# Patient Record
Sex: Male | Born: 1955
Health system: Southern US, Community
[De-identification: ages and names within clinical notes are randomized; demographics above are authoritative.]

## PROBLEM LIST (undated history)

## (undated) DIAGNOSIS — E785 Hyperlipidemia, unspecified: Secondary | ICD-10-CM

## (undated) DIAGNOSIS — I10 Essential (primary) hypertension: Secondary | ICD-10-CM

## (undated) DIAGNOSIS — E039 Hypothyroidism, unspecified: Secondary | ICD-10-CM

## (undated) DIAGNOSIS — Z9109 Other allergy status, other than to drugs and biological substances: Secondary | ICD-10-CM

## (undated) DIAGNOSIS — H269 Unspecified cataract: Secondary | ICD-10-CM

## (undated) HISTORY — DX: Hyperlipidemia, unspecified: E78.5

## (undated) HISTORY — DX: Hypothyroidism, unspecified: E03.9

## (undated) HISTORY — DX: Essential (primary) hypertension: I10

## (undated) HISTORY — PX: COLONOSCOPY: SHX174

## (undated) HISTORY — DX: Other allergy status, other than to drugs and biological substances: Z91.09

## (undated) HISTORY — DX: Unspecified cataract: H26.9

---
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
## 2005-09-06 LAB — HM COLONOSCOPY: HM Colonoscopy: NORMAL

## 2006-01-12 ENCOUNTER — Encounter: Payer: Self-pay | Admitting: Family Medicine

## 2006-12-04 ENCOUNTER — Encounter: Payer: Self-pay | Admitting: Family Medicine

## 2007-12-06 ENCOUNTER — Encounter: Payer: Self-pay | Admitting: Family Medicine

## 2008-10-06 ENCOUNTER — Encounter: Payer: Self-pay | Admitting: Family Medicine

## 2008-11-09 ENCOUNTER — Encounter: Payer: Self-pay | Admitting: Family Medicine

## 2009-06-22 ENCOUNTER — Ambulatory Visit: Payer: Self-pay | Admitting: Family Medicine

## 2009-06-22 DIAGNOSIS — E039 Hypothyroidism, unspecified: Secondary | ICD-10-CM

## 2009-06-22 DIAGNOSIS — I10 Essential (primary) hypertension: Secondary | ICD-10-CM | POA: Insufficient documentation

## 2009-08-16 ENCOUNTER — Ambulatory Visit: Payer: Self-pay | Admitting: Family Medicine

## 2009-08-18 ENCOUNTER — Ambulatory Visit: Payer: Self-pay | Admitting: Family Medicine

## 2009-11-24 ENCOUNTER — Ambulatory Visit: Payer: Self-pay | Admitting: Family Medicine

## 2009-11-26 LAB — CONVERTED CEMR LAB
BUN: 10 mg/dL (ref 6–23)
Basophils Relative: 0.2 % (ref 0.0–3.0)
Cholesterol: 197 mg/dL (ref 0–200)
Creatinine, Ser: 1 mg/dL (ref 0.4–1.5)
Eosinophils Relative: 1.4 % (ref 0.0–5.0)
GFR calc non Af Amer: 86.49 mL/min (ref >60)
HCT: 47.8 % (ref 39.0–52.0)
Hemoglobin: 16.6 g/dL (ref 13.0–17.0)
LDL Cholesterol: 134 mg/dL — ABNORMAL HIGH (ref 0–99)
Lymphs Abs: 1.4 10*3/uL (ref 0.7–4.0)
MCV: 94.6 fL (ref 78.0–100.0)
Monocytes Absolute: 0.4 10*3/uL (ref 0.1–1.0)
Potassium: 4.8 meq/L (ref 3.5–5.1)
RBC: 5.05 M/uL (ref 4.22–5.81)
Total Bilirubin: 1.2 mg/dL (ref 0.3–1.2)
Triglycerides: 161 mg/dL — ABNORMAL HIGH (ref 0.0–149.0)
VLDL: 32.2 mg/dL (ref 0.0–40.0)
WBC: 6.9 10*3/uL (ref 4.5–10.5)

## 2010-03-10 NOTE — Letter (Signed)
Summary: Out of Work  [facility] [employer] [facility] at [facility]  [address] [place], [place] [address]   Phone: [phone]  Fax: [phone]    August 16, 2009   Employee:  EZRAEL SAM    To Whom It May Concern:   For Medical reasons, please excuse the above named employee from work for the following dates:  Start:   08/16/2009  End:   he can return on 08/20/2009 if he feels better   If you need additional information, please feel free to contact our office.         Sincerely,    Judith Part MD

## 2010-03-10 NOTE — Letter (Signed)
Summary: Carlos Larson @ [facility] @ [employer]   Imported By: Lanelle Bal 07/12/2009 10:25:27  _____________________________________________________________________  External Attachment:    Type:   Image     Comment:   External Document

## 2010-03-10 NOTE — Letter (Signed)
Summary: Patient Questionnaire  Patient Questionnaire   Imported By: Beau Fanny 06/23/2009 10:09:21  _____________________________________________________________________  External Attachment:    Type:   Image     Comment:   External Document

## 2010-03-10 NOTE — Assessment & Plan Note (Signed)
Summary: CPX / LFW   Vital Signs:  Patient profile:   55 year old male Height:      67 inches Weight:      174.50 pounds BMI:     27.43 Temp:     98.3 degrees F oral Pulse rate:   80 / minute Pulse rhythm:   regular BP sitting:   126 / 84  (left arm) Cuff size:   regular  Vitals Entered By: Lewanda Rife LPN (November 24, 2009 9:35 AM) CC: CPX   History of Present Illness: here for health mt exam and to disc chronic med problems   feeling ok - nothing new   wt is down 2 lb-- is very active  getting ready to start hunting again    bp in good control 126/84   hypothyroid - does not feel different  father had colon ca last colonosc 07 nl - due in 2012  (was at [facility])    Td 7/11  is due for labs -- is fasting   psa- needs checked  prostate - usually gets up once at night no flow problems   flu shot - does not get them   no new moles or skin spots  sometimes wears a hat   Allergies (verified): No Known Drug Allergies  Past History:  Past Surgical History: Last updated: 06/22/2009 Tonsillectomy 1971  Family History: Last updated: 06/22/2009 Father: Living : colon cancer, high blood pressure Mother: Living : Heart disease/ valvular dz , hig blood pressure Maternal grandfather: Prostate cancer Paternal grandfather: High blood pressure MGM with CAD  no diabetes in family   Social History: Last updated: 06/22/2009 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 Eyes:  Denies blurring and eye irritation. CV:  Denies chest pain or discomfort and lightheadness. Resp:  Denies cough and wheezing. GI:  Denies abdominal pain, change in bowel habits, and  indigestion. GU:  Denies dysuria and urinary frequency. MS:  Denies muscle aches and cramps; some occ aches and pains . Derm:  Denies itching, lesion(s), poor wound healing, and rash. Neuro:  Denies headaches, numbness, and tingling. Psych:  Denies anxiety and depression. Endo:  Denies excessive thirst and excessive urination. Heme:  Denies abnormal bruising and bleeding.  Physical Exam  General:  Well-developed,well-nourished,in no acute distress; alert,appropriate and cooperative throughout examination Head:  normocephalic, atraumatic, and no abnormalities observed.   Eyes:  vision grossly intact, pupils equal, pupils round, and pupils reactive to light.  no conjunctival pallor, injection or icterus  Ears:  R ear normal and L ear normal.   Nose:  no nasal discharge.   Mouth:  pharynx pink and moist.   Neck:  supple with full rom and no masses or thyromegally, no JVD or carotid bruit  Chest Wall:  No deformities, masses, tenderness or gynecomastia noted. Lungs:  Normal respiratory effort, chest expands symmetrically. Lungs are clear to auscultation, no crackles or wheezes. Heart:  Normal rate and regular rhythm. S1 and S2 normal without gallop, murmur, click, rub or other extra sounds. Abdomen:  Bowel sounds positive,abdomen soft and non-tender without masses, organomegaly or hernias noted. no renal bruits  no renal bruits  Rectal:  No external abnormalities noted. Normal sphincter tone. No rectal  masses or tenderness. Prostate:  Prostate gland firm and smooth, no enlargement, nodularity, tenderness, mass, asymmetry or induration. Msk:  No deformity or scoliosis noted of thoracic or lumbar spine.  no acute joint changes  Pulses:  R and L carotid,radial,femoral,dorsalis pedis and posterior tibial pulses are full and equal bilaterally Extremities:  No clubbing, cyanosis, edema, or deformity noted with normal full range of motion of all joints.   Neurologic:  sensation intact to light touch,  gait normal, and DTRs symmetrical and normal.   Skin:  ruddy complexion  no rash  some lentigos  Cervical Nodes:  No lymphadenopathy noted Inguinal Nodes:  No significant adenopathy Psych:  quiet/ pleasant affect     Impression & Recommendations:  Problem # 1:  HEALTH MAINTENANCE EXAM (ICD-V70.0) Assessment Comment Only reviewed health habits including diet, exercise and skin cancer prevention reviewed health maintenance list and family history wellness lab today Orders: Venipuncture (54098) TLB-Lipid Panel (80061-LIPID) TLB-BMP (Basic Metabolic Panel-BMET) (80048-METABOL) TLB-Hepatic/Liver Function Pnl (80076-HEPATIC) TLB-CBC Platelet - w/Differential (85025-CBCD) TLB-TSH (Thyroid Stimulating Hormone) (84443-TSH) Prescription Created Electronically [id number])  Problem # 2:  SPECIAL SCREENING MALIGNANT NEOPLASM OF PROSTATE (ICD-V76.44) Assessment: Comment Only nl dre psa today Orders: Venipuncture (78295) TLB-Lipid Panel (80061-LIPID) TLB-BMP (Basic Metabolic Panel-BMET) (80048-METABOL) TLB-Hepatic/Liver Function Pnl (80076-HEPATIC) TLB-CBC Platelet - w/Differential (85025-CBCD) TLB-TSH (Thyroid Stimulating Hormone) (84443-TSH) TLB-PSA (Prostate Specific Antigen) (84153-PSA) Prescription Created Electronically [id number])  Problem # 3:  HYPOTHYROIDISM (ICD-244.9) Assessment: Unchanged no clinical changes  check lab today and update- tsh His updated medication list for this problem includes:    Levothyroxine Sodium 100 Mcg Tabs (Levothyroxine sodium) .Marland Kitchen... Take 1 tablet by mouth once a day  Orders: Venipuncture (86578) TLB-Lipid Panel (80061-LIPID) TLB-BMP (Basic Metabolic Panel-BMET) (80048-METABOL) TLB-Hepatic/Liver Function Pnl (80076-HEPATIC) TLB-CBC Platelet - w/Differential (85025-CBCD) TLB-TSH (Thyroid Stimulating Hormone) (46962-XBM) Prescription Created Electronically [phone])  Problem # 4:  HYPERTENSION, ESSENTIAL NOS (ICD-401.9) Assessment: Unchanged  bp  in good control disc healthy diet (low simple sugar/ choose complex carbs/ low sat fat) diet and exercise in detail  no change in med lab today His updated medication list for this problem includes:    Benazepril-hydrochlorothiazide 10-12.5 Mg Tabs (Benazepril-hydrochlorothiazide) .Marland Kitchen... Take 1 tablet by mouth once a day  Orders: Venipuncture (44010) TLB-Lipid Panel (80061-LIPID) TLB-BMP (Basic Metabolic Panel-BMET) (80048-METABOL) TLB-Hepatic/Liver Function Pnl (80076-HEPATIC) TLB-CBC Platelet - w/Differential (85025-CBCD) TLB-TSH (Thyroid Stimulating Hormone) (27253-GUY) Prescription Created Electronically [phone])  BP today: 126/84 Prior BP: 126/84 (08/18/2009)  Complete Medication List: 1)  Levothyroxine Sodium 100 Mcg Tabs (Levothyroxine sodium) .... Take 1 tablet by mouth once a day 2)  Benazepril-hydrochlorothiazide 10-12.5 Mg Tabs (Benazepril-hydrochlorothiazide) .... Take 1 tablet by mouth once a day 3)  Fish Oil 1200 Mg  .Marland KitchenMarland Kitchen. 1 by mouth once daily 4)  Vitamin D  .Marland Kitchen.. 1000 international units per day  Patient Instructions: 1)  you will be due for colonoscopy in 09/2010 -- call us june or july to schedule colonoscopy  2)  try to eat a healthy diet and stay active  3)  no change in medicine 4)  labs today  Prescriptions: BENAZEPRIL-HYDROCHLOROTHIAZIDE 10-12.5 MG TABS (BENAZEPRIL-HYDROCHLOROTHIAZIDE) Take 1 tablet by mouth once a day  #90 x 3   Entered and Authorized by:   Judith Part MD   Signed by:   Judith Part MD on 11/24/2009   Method used:   Electronically to        [facility] [phone]* (retail)       [phone]  [place], [place]  [address]       Ph: [phone] or [phone]       Fax: [phone]   RxID:   [id number] LEVOTHYROXINE SODIUM 100 MCG TABS (LEVOTHYROXINE SODIUM) Take 1 tablet by mouth once a day  #90 x 3   Entered and Authorized by:   Judith Part MD   Signed by:   Judith Part MD on  11/24/2009   Method used:   [facility] to        [facility] [phone]* (retail)       [address]       [place], [place]  [address]       Ph: [phone] or [phone]       Fax: [phone]   RxID:   [id number]    Orders Added: 1)  Venipuncture [93810] 2)  TLB-Lipid Panel [80061-LIPID] 3)  TLB-BMP (Basic Metabolic Panel-BMET) [80048-METABOL] 4)  TLB-Hepatic/Liver Function Pnl [80076-HEPATIC] 5)  TLB-CBC Platelet - w/Differential [85025-CBCD] 6)  TLB-TSH (Thyroid Stimulating Hormone) [84443-TSH] 7)  TLB-PSA (Prostate Specific Antigen) [17510-CHE] 8)  Prescription Created Electronically [G8553] 9)  Est. Patient 40-64 years [52778]    Current Allergies (reviewed today): No known allergies

## 2010-03-10 NOTE — Procedures (Signed)
Summary: Colonoscopy/[facility]  Colonoscopy/[facility]   Imported By: Lanelle Bal 07/12/2009 10:27:13  _____________________________________________________________________  External Attachment:    Type:   Image     Comment:   External Document

## 2010-03-10 NOTE — Assessment & Plan Note (Signed)
Summary: ROA FOR RECHECK OF HAND/JRR   Vital Signs:  Patient profile:   55 year old male Height:      67 inches Weight:      176.50 pounds BMI:     27.74 Temp:     97.5 degrees F oral Pulse rate:   72 / minute Pulse rhythm:   regular BP sitting:   126 / 84  (right arm) Cuff size:   regular  Vitals Entered By: Lewanda Rife LPN (August 18, 2009 4:01 PM) CC: recheck left hand   History of Present Illness: here for re check of dog bite to hand  on augmentin and had Td update at last visit   no fever  feels fine  has been resting his hand  sore to move - but does not throb or cause pain otherwise  still some swelling -- but looks better overall and not red      Allergies (verified): No Known Drug Allergies  Past History:  Past Medical History: Last updated: 06/22/2009 High blood pressure readings Thyroid problem mild pollen allergies   Past Surgical History: Last updated: 06/22/2009 Tonsillectomy 1971  Family History: Last updated: 06/22/2009 Father: Living : colon cancer, high blood pressure Mother: Living : Heart disease/ valvular dz , hig blood pressure Maternal grandfather: Prostate cancer Paternal grandfather: High blood pressure MGM with CAD  no diabetes in family   Social History: Last updated: 06/22/2009 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 CV:  Denies chest pain or discomfort. GI:  Denies abdominal pain, diarrhea, nausea, and vomiting. MS:  Complains of stiffness; denies muscle weakness. Derm:  Denies itching and rash. Neuro:  Denies numbness, tingling, and weakness.  Physical Exam  General:  Well-developed,well-nourished,in no acute distress; alert,appropriate and cooperative throughout examination Neck:  No  deformities, masses, or tenderness noted. Heart:  Normal rate and regular rhythm. S1 and S2 normal without gallop, murmur, click, rub or other extra sounds. Msk:  no acute joint changes  Skin:  L hand improvement in dog bite area  puncture wounds on dorsal hand and 3rd finger are healed without drainage slt erythema surrounding wounds swelling is improved  mild tenderness around periphery of dorsal hand wound is able to move all fingers with some discomfort  Cervical Nodes:  No lymphadenopathy noted Psych:  normal affect, talkative and pleasant    Impression & Recommendations:  Problem # 1:  DOG BITE (ICD-E906.0) improvement with augmentin and bacrtroban disc work schedule adv to finish abx and keep covered in public  update asap if inc swelling/redness/ pain or any fever  Complete Medication List: 1)  Levothyroxine Sodium 100 Mcg Tabs (Levothyroxine sodium) .... Take 1 tablet by mouth once a day 2)  Benazepril-hydrochlorothiazide 10-12.5 Mg Tabs (Benazepril-hydrochlorothiazide) .... Take 1 tablet by mouth once a day 3)  Fish Oil 1200 Mg  .Marland KitchenMarland Kitchen. 1 by mouth once daily 4)  Vitamin D  .Marland Kitchen.. 1000 international units per day 5)  Excedrin Extra Strength 250-250-65 Mg Tabs (Aspirin-acetaminophen-caffeine) .... Otc as directed. 6)  Bactroban Nasal 2 % Oint (Mupirocin calcium) .... Apply to affected area two times a day 7)  Augmentin 875-125 Mg Tabs (Amoxicillin-pot clavulanate) .Marland Kitchen.. 1 by mouth two times a day for 10 days  Patient Instructions: 1)  return to work as planned tomorrow  for light duty 2)  dress wound loosely when out in public  3)  continue current medicines  4)  update me if increase in swelling / pain/ redness or any fever - or if not improved next week   Current Allergies (reviewed today): No known allergies

## 2010-03-10 NOTE — Letter (Signed)
Summary: Out of Work  [employer] [facility] at [facility]  [address] [place], [place] [address]   Phone: [phone]  Fax: [phone]    August 18, 2009   Employee:  ROSHAD HACK    To Whom It May Concern:   For Medical reasons, please excuse the above named employee from work for the following dates:  Start:   08/18/2009  End:   can return for light duty 7/14 and 7/15 , and then full duty monday 08/23/2009 if he is feeling better   If you need additional information, please feel free to contact our office.         Sincerely,    Judith Part MD

## 2010-03-10 NOTE — Assessment & Plan Note (Signed)
Summary: NEW PT TO EST/CLE   Vital Signs:  Patient profile:   55 year old male Height:      67 inches Weight:      179 pounds BMI:     28.14 Temp:     98 degrees F oral Pulse rate:   68 / minute Pulse rhythm:   regular BP sitting:   134 / 90  (left arm) Cuff size:   regular  Vitals Entered By: Lewanda Rife LPN (Jun 22, 2009 11:18 AM)  Serial Vital Signs/Assessments:  Time      Position  BP       Pulse  Resp  Temp     By                     130/80                         Judith Part MD  CC: New pt to establish   History of Present Illness: here to est as new pt  used to see Dr at [facility]   HTN -- on benazepril- hct and stays in good control  usually when he checks at home -- usually upper 120s/ 70s-80 -- very good  no side eff from his medicine    last labs were in october -- with his last PE ? if did pSA at that time   has hypothyroid  has always been low no hx of problems - with goiter or overactive thyroid  labs were good in october   takes vit D 1000 international units per day  fish oil -- 1200 mg per day   eats a very healthy diet  works out with [employer] and hunts with his dogs   cholesterol has not been high  no hx of asthma some miild pollen allergies    Preventive Screening-Counseling & Management  Caffeine-Diet-Exercise     Does Patient Exercise: yes      Drug Use:  no.    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 Alcohol use-no Drug use-no Regular exercise-yes- lifts weights  Stopped smoking 1974 (smoked for 4 years)   Risk  Factors: Exercise: yes (06/22/2009)  Past Medical History: High blood pressure readings Thyroid problem mild pollen allergies   Past Surgical History: Tonsillectomy 1971  Family History: Father: Living : colon cancer, high blood pressure Mother: Living : Heart disease/ valvular dz , hig blood pressure Maternal grandfather: Prostate cancer Paternal grandfather: High blood pressure MGM with CAD  no diabetes in family   Social History: 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 Eyes:  Denies blurring and eye pain. CV:  Denies chest pain or discomfort and palpitations. Resp:  Denies cough, shortness of breath, sputum productive, and wheezing. GI:  Denies abdominal pain, bloody stools, change in bowel habits, indigestion, and nausea. GU:  Denies nocturia, urinary frequency, and urinary  hesitancy. MS:  Denies muscle aches and cramps. Derm:  Denies lesion(s), poor wound healing, and rash. Neuro:  Denies numbness and tingling. Psych:  Denies anxiety and depression. Endo:  Denies cold intolerance, excessive thirst, excessive urination, and heat intolerance. Heme:  Denies abnormal bruising and bleeding.  Physical Exam  General:  Well-developed,well-nourished,in no acute distress; alert,appropriate and cooperative throughout examination Head:  normocephalic, atraumatic, and no abnormalities observed.   Eyes:  vision grossly intact, pupils equal, pupils round, and pupils reactive to light.  no conjunctival pallor, injection or icterus  Mouth:  pharynx pink and moist.   Neck:  supple with full rom and no masses or thyromegally, no JVD or carotid bruit  Chest Wall:  No deformities, masses, tenderness or gynecomastia noted. Lungs:   Normal respiratory effort, chest expands symmetrically. Lungs are clear to auscultation, no crackles or wheezes. Heart:  Normal rate and regular rhythm. S1 and S2 normal without gallop, murmur, click, rub or other extra sounds. Abdomen:  Bowel sounds positive,abdomen soft and non-tender without masses, organomegaly or hernias noted. no renal bruits  Msk:  No deformity or scoliosis noted of thoracic or lumbar spine.  no acute joint changes  Extremities:  No clubbing, cyanosis, edema, or deformity noted with normal full range of motion of all joints.   Neurologic:  sensation intact to light touch, gait normal, and DTRs symmetrical and normal.  no tremor  Skin:  Intact without suspicious lesions or rashes Cervical Nodes:  No lymphadenopathy noted Inguinal Nodes:  No significant adenopathy Psych:  somewhat stoic affect    Impression & Recommendations:  Problem # 1:  HYPOTHYROIDISM (ICD-244.9) Assessment New no clinical changes - stable tsh per pt last oct  will send for last note and labs  f/u oct for PE with labs that day His updated medication list for this problem includes:    Levothyroxine Sodium 100 Mcg Tabs (Levothyroxine sodium) .Marland Kitchen... Take 1 tablet by mouth once a day  Problem # 2:  HYPERTENSION, ESSENTIAL NOS (ICD-401.9) Assessment: New this is in good control -- better reading with second check today disc low salt diet overall good exercise- commended no change in med sent for records f/u in oct for PE and labs that day refilled med today His updated medication list for this problem includes:    Benazepril-hydrochlorothiazide 10-12.5 Mg Tabs (Benazepril-hydrochlorothiazide) .Marland Kitchen... Take 1 tablet by mouth once a day  Complete Medication List: 1)  Levothyroxine Sodium 100 Mcg Tabs (Levothyroxine sodium) .... Take 1 tablet by mouth once a day 2)  Benazepril-hydrochlorothiazide 10-12.5 Mg Tabs (Benazepril-hydrochlorothiazide) .... Take 1 tablet by mouth once a day 3)  Fish Oil  1200 Mg  .Marland KitchenMarland Kitchen. 1 by mouth once daily 4)  Vitamin D  .Marland Kitchen.. 1000 international units per day  Patient Instructions: 1)  please send for last note and labs from [place] physicians (I think october for labs )  2)  no change in medicines 3)  keep up the good health habits  4)  schedule am physical in october (nothing to eat or drink after midnight except water)  Prescriptions: BENAZEPRIL-HYDROCHLOROTHIAZIDE 10-12.5 MG TABS (BENAZEPRIL-HYDROCHLOROTHIAZIDE) Take 1 tablet by mouth once a day  #90 x 3   Entered and Authorized by:   Judith Part MD   Signed by:   Judith Part MD on 06/22/2009   Method used:   [facility]        [facility] [phone]* (retail)       [address] [place]  [address]       Ph: [phone] or [phone]       Fax: [phone]   RxID:   [id number]   Current Allergies (reviewed today): No known allergies    Immunization History:  Tetanus/Td Immunization History:    Tetanus/Td:  historical (12/11/2001)    Preventive Care Screening  Colonoscopy:    Date:  09/06/2005    Next Due:  09/2010    Results:  normal   Last Tetanus Booster:    Date:  12/11/2001    Results:  Historical     Preventive Care Screening  Colonoscopy:    Date:  09/06/2005    Next Due:  09/2010    Results:  normal   Last Tetanus Booster:    Date:  12/11/2001    Results:  Historical

## 2010-03-10 NOTE — Letter (Signed)
Summary: Deboraha Sprang @ [facility] @ [employer]   Imported By: Lanelle Bal 07/12/2009 10:26:18  _____________________________________________________________________  External Attachment:    Type:   Image     Comment:   External Document

## 2010-03-10 NOTE — Assessment & Plan Note (Signed)
Summary: DOG BITE TO HAND   Vital Signs:  Patient profile:   55 year old male Height:      67 inches Weight:      178.25 pounds BMI:     28.02 Temp:     97.8 degrees F oral Pulse rate:   68 / minute Pulse rhythm:   regular BP sitting:   132 / 90  (right arm) Cuff size:   regular  Vitals Entered By: Lewanda Rife LPN (August 16, 2009 10:18 AM) CC: dog bit to hand on 08/15/09 at 6:45pm Last Td 12/11/2001   History of Present Illness: was bitten by a dog  yesterday-- 2 of his dogs started to fight (after breaking his cable)  he tried to break it up  bit him on the top of L hand -- after grabbing him by the collar   is still bleeding/ oozing a bit  is not hurting a lot -- just when he moves finger -- is stiff and swollen excedrin helped the pain   the boxer bit him  dog is up to date on shots    Td was in 03      Allergies (verified): No Known Drug Allergies  Past History:  Past Medical History: Last updated: 06/22/2009 High blood pressure readings Thyroid problem mild pollen allergies   Past Surgical History: Last updated: 06/22/2009 Tonsillectomy 1971  Family History: Last updated: 06/22/2009 Father: Living : colon cancer, high blood pressure Mother: Living : Heart disease/ valvular dz , hig blood pressure Maternal grandfather: Prostate cancer Paternal grandfather: High blood pressure MGM with CAD  no diabetes in family   Social History: Last updated: 06/22/2009 Occupation:Lineman for [employer]  Married Alcohol use-no Drug use-no Regular exercise-yes- lifts weights  Stopped smoking 1974 (smoked for 4 years)   Risk Factors: Exercise: yes (06/22/2009)  Review of Systems General:  Denies chills, fatigue, fever, and malaise. Eyes:  Denies eye irritation. CV:  Denies chest pain or discomfort and palpitations. Resp:  Denies shortness of breath and wheezing. GI:  Denies abdominal pain, change in bowel habits, and indigestion. MS:  Complains of  stiffness; denies cramps and muscle weakness. Derm:  Denies itching, lesion(s), poor wound healing, and rash. Neuro:  Denies numbness, tingling, and weakness. Endo:  Denies excessive thirst and excessive urination. Heme:  Denies abnormal bruising and bleeding.  Physical Exam  General:  Well-developed,well-nourished,in no acute distress; alert,appropriate and cooperative throughout examination Head:  normocephalic, atraumatic, and no abnormalities observed.   Eyes:  vision grossly intact, pupils equal, pupils round, and pupils reactive to light.   Neck:  supple with full rom and no masses or thyromegally, no JVD or carotid bruit  Lungs:  Normal respiratory effort, chest expands symmetrically. Lungs are clear to auscultation, no crackles or wheezes. Heart:  Normal rate and regular rhythm. S1 and S2 normal without gallop, murmur, click, rub or other extra sounds. Msk:  L hand with 1-2 cm laceration on dorsum of hand with swelling and also inner 3rd finger  mild ecchymosis no redness or warmth  Pulses:  nl pulses  Neurologic:  strength normal in all extremities, sensation intact to light touch, and DTRs symmetrical and normal.   Skin:  see MS exam for hand injury Cervical Nodes:  No lymphadenopathy noted Psych:  normal affect, talkative and pleasant    Impression & Recommendations:  Problem # 1:  DOG BITE (ICD-E906.0)  to L hand - with superficial puncture wound and full functionality  tx with augmentin  and bactroban - off work note f/u wed or thurs for re heck red flags to watch for disc as well as wound care  Td today  Orders: Prescription Created Electronically [phone])  Complete Medication List: 1)  Levothyroxine Sodium 100 Mcg Tabs (Levothyroxine sodium) .... Take 1 tablet by mouth once a day 2)  Benazepril-hydrochlorothiazide 10-12.5 Mg Tabs (Benazepril-hydrochlorothiazide) .... Take 1 tablet by mouth once a day 3)  Fish Oil 1200 Mg  .Marland KitchenMarland Kitchen. 1 by mouth once daily 4)  Vitamin D   .Marland Kitchen.. 1000 international units per day 5)  Excedrin Extra Strength 250-250-65 Mg Tabs (Aspirin-acetaminophen-caffeine) .... Otc as directed. 6)  Bactroban Nasal 2 % Oint (Mupirocin calcium) .... Apply to affected area two times a day 7)  Augmentin 875-125 Mg Tabs (Amoxicillin-pot clavulanate) .Marland Kitchen.. 1 by mouth two times a day for 10 days  Other Orders: TD Toxoids IM 7 YR + (47829) Admin 1st Vaccine (56213)  Patient Instructions: 1)  clean wound with antibacterial soap and water 2)  dress twice daily with clean gauze and bactroban ointment 3)  take augmentin as directed  4)  update me asap if pain / redness worsens or any fever  5)  follow up with me wed or thursday for re check  Prescriptions: AUGMENTIN 875-125 MG TABS (AMOXICILLIN-POT CLAVULANATE) 1 by mouth two times a day for 10 days  #20 x 0   Entered and Authorized by:   Judith Part MD   Signed by:   Judith Part MD on 08/16/2009   Method used:   [facility] to        [facility] (retail)       [address]       [place], [place]  [address]       Ph: [phone] or [phone]       Fax: [phone]   RxID:   [id number] BACTROBAN NASAL 2 % OINT (MUPIROCIN CALCIUM) apply to affected area two times a day  #1 small x 0   Entered and Authorized by:   Judith Part MD   Signed by:   Judith Part MD on 08/16/2009   Method used:   [facility]        [id number]       [address]       [place], [place]  [address]       Ph: [phone] or [phone]       Fax: [phone]   RxID:   [id number]    Immunizations Administered:  Tetanus Vaccine:    Vaccine Type: Td    Site: left deltoid    Mfr: Sanofi Pasteur    Dose: 0.5 ml    Route: IM    Given by: Lewanda Rife LPN    Exp. Date: 03/04/2011    Lot #: [id number]    VIS given: 12/25/06 version given August 16, 2009.   Current Allergies (reviewed today): No known allergies

## 2010-07-01 ENCOUNTER — Other Ambulatory Visit: Payer: Self-pay | Admitting: *Deleted

## 2010-07-01 MED ORDER — BENAZEPRIL-HYDROCHLOROTHIAZIDE 10-12.5 MG PO TABS: 1.0000 | ORAL_TABLET | Freq: Every day | ORAL | Status: DC

## 2010-09-27 ENCOUNTER — Telehealth: Payer: Self-pay | Admitting: *Deleted

## 2010-09-27 NOTE — Telephone Encounter (Signed)
That is a good idea- go ahead and schedule for the shot

## 2010-09-27 NOTE — Telephone Encounter (Signed)
Pt called because he was told to call in June or July to set up colonoscopy.  He he called to do that he was told that he isnt due for one until December, but he will be on vacation the entire month of December so will have it done in January.  [facility] will call him in November to schedule.  Also, pt is asking if he can get a Tdap, has a new baby in the family.

## 2010-09-27 NOTE — Telephone Encounter (Signed)
Will send to Jacki Cones to schedule.

## 2010-09-30 NOTE — Telephone Encounter (Signed)
Left message with pt's brother for him to call back.

## 2010-11-29 ENCOUNTER — Other Ambulatory Visit: Payer: Self-pay | Admitting: *Deleted

## 2010-11-29 MED ORDER — LEVOTHYROXINE SODIUM 100 MCG PO TABS: 100.0000 ug | ORAL_TABLET | Freq: Every day | ORAL | Status: DC

## 2010-12-22 ENCOUNTER — Other Ambulatory Visit: Payer: Self-pay | Admitting: *Deleted

## 2010-12-22 MED ORDER — BENAZEPRIL-HYDROCHLOROTHIAZIDE 10-12.5 MG PO TABS: 1.0000 | ORAL_TABLET | Freq: Every day | ORAL | Status: DC

## 2010-12-23 ENCOUNTER — Encounter: Payer: Self-pay | Admitting: Family Medicine

## 2010-12-26 ENCOUNTER — Encounter: Payer: Self-pay | Admitting: Family Medicine

## 2010-12-26 ENCOUNTER — Ambulatory Visit (INDEPENDENT_AMBULATORY_CARE_PROVIDER_SITE_OTHER): Payer: 59 | Admitting: Family Medicine

## 2010-12-26 ENCOUNTER — Encounter: Payer: Self-pay | Admitting: Internal Medicine

## 2010-12-26 VITALS — BP 118/78 | HR 68 | Temp 97.6°F | Ht 66.75 in | Wt 175.2 lb

## 2010-12-26 DIAGNOSIS — Z125 Encounter for screening for malignant neoplasm of prostate: Secondary | ICD-10-CM

## 2010-12-26 DIAGNOSIS — Z800 Family history of malignant neoplasm of digestive organs: Secondary | ICD-10-CM

## 2010-12-26 DIAGNOSIS — E039 Hypothyroidism, unspecified: Secondary | ICD-10-CM

## 2010-12-26 DIAGNOSIS — Z0000 Encounter for general adult medical examination without abnormal findings: Secondary | ICD-10-CM

## 2010-12-26 DIAGNOSIS — I10 Essential (primary) hypertension: Secondary | ICD-10-CM

## 2010-12-26 LAB — COMPREHENSIVE METABOLIC PANEL
ALT: 29 U/L (ref 0–53)
CO2: 31 mEq/L (ref 19–32)
Creatinine, Ser: 1 mg/dL (ref 0.4–1.5)
GFR: 87.2 mL/min (ref >60.00)
Glucose, Bld: 93 mg/dL (ref 70–99)
Total Bilirubin: 1.2 mg/dL (ref 0.3–1.2)

## 2010-12-26 LAB — LIPID PANEL
HDL: 35.9 mg/dL — ABNORMAL LOW (ref >39.00)
Triglycerides: 114 mg/dL (ref 0.0–149.0)

## 2010-12-26 LAB — CBC WITH DIFFERENTIAL/PLATELET
Basophils Absolute: 0 10*3/uL (ref 0.0–0.1)
HCT: 49.2 % (ref 39.0–52.0)
Hemoglobin: 16.6 g/dL (ref 13.0–17.0)
Lymphs Abs: 1.5 10*3/uL (ref 0.7–4.0)
MCHC: 33.8 g/dL (ref 30.0–36.0)
Monocytes Relative: 4 % (ref 3.0–12.0)
Neutro Abs: 4.8 10*3/uL (ref 1.4–7.7)
RDW: 13.1 % (ref 11.5–14.6)

## 2010-12-26 LAB — TSH: TSH: 0.84 u[IU]/mL (ref 0.35–5.50)

## 2010-12-26 MED ORDER — LEVOTHYROXINE SODIUM 100 MCG PO TABS: 100.0000 ug | ORAL_TABLET | Freq: Every day | ORAL | Status: DC

## 2010-12-26 MED ORDER — BENAZEPRIL-HYDROCHLOROTHIAZIDE 10-12.5 MG PO TABS: 1.0000 | ORAL_TABLET | Freq: Every day | ORAL | Status: DC

## 2010-12-26 NOTE — Assessment & Plan Note (Signed)
bp in fair control at this time  No changes needed  Disc lifstyle change with low sodium diet and exercise   meds refilled  

## 2010-12-26 NOTE — Progress Notes (Signed)
Subjective:    Patient ID: Carlos Larson, male    DOB: [birth year], 55 y.o.   MRN: [id number]  HPI Here for annual health mt exam and also to review chronic med problems  Is doing well - will be taking a whole month off in dec for vacation  Lots to do  Work is doing well too   HTN in good control 118/78 today On ace/ hct- no problems   Wt is stable with bmi of 27 Diet--eats healthy in general - notices more brisk bms esp with salads  Exercise- goes hunting and walks a lot with that  Otherwise just work   Due for labs today-- wants to do those now   Hx of hypothyroid Lab Results  Component Value Date   TSH 0.82 11/24/2009   No dose changes recently No clinical changes  No skin or hair changes   Flu shot- got at work mid [employer] nl 07 Father had colon cancer  Told by [facility] physicians -- needs to have done after December  Wants to change to [place]   Td 2011  Due for prostate screening  Does have to get up more at night - blames the diuretic  Stream is not quite as strong  GF and great uncle had prostate cancer   Patient Active Problem List  Diagnoses  . HYPOTHYROIDISM  . HYPERTENSION, ESSENTIAL NOS  . Routine general medical examination at a health care facility  . Prostate cancer screening  . Family history of colon cancer   Past Medical History  Diagnosis Date  . HTN (hypertension)   . Hypothyroid   . Pollen allergies     mild   Past Surgical History  Procedure Date  . Tonsillectomy 1971   History  Substance Use Topics  . Smoking status: Former Smoker    Quit date: 02/07/1972  . Smokeless tobacco: Not on file   Comment: Smoked for 4 years  . Alcohol Use: No   Family History  Problem Relation Age of Onset  . Colon cancer Father   . Hypertension Father   . Heart disease Mother     valvular disease  . Hypertension Mother   . Prostate cancer Maternal Grandfather   . Hypertension Paternal Grandfather   . Coronary artery disease  Maternal Grandmother   . Diabetes Neg Hx    Not on File Current Outpatient Prescriptions on File Prior to Visit  Medication Sig Dispense Refill  . cholecalciferol (VITAMIN D) 1000 UNITS tablet Take 1,000 Units by mouth daily.        . Omega-3 Fatty Acids (FISH OIL) 1200 MG CAPS Take 1 capsule by mouth daily.              Review of Systems Review of Systems  Constitutional: Negative for fever, appetite change, fatigue and unexpected weight change.  Eyes: Negative for pain and visual disturbance.  Respiratory: Negative for cough and shortness of breath.   Cardiovascular: Negative for cp or palpitations    Gastrointestinal: Negative for nausea, diarrhea and constipation.  Genitourinary: Negative for urgency and frequency.  Skin: Negative for pallor or rash   Neurological: Negative for weakness, light-headedness, numbness and headaches.  Hematological: Negative for adenopathy. Does not bruise/bleed easily.  Psychiatric/Behavioral: Negative for dysphoric mood. The patient is not nervous/anxious.          Objective:   Physical Exam  Constitutional: He appears well-developed and well-nourished. No distress.  HENT:  Head: Normocephalic and atraumatic.  Right Ear: External ear normal.  Left Ear: External ear normal.  Nose: Nose normal.  Mouth/Throat: Oropharynx is clear and moist.  Eyes: Conjunctivae and EOM are normal. Pupils are equal, round, and reactive to light. No scleral icterus.  Neck: Normal range of motion. Neck supple. No JVD present. Carotid bruit is not present. No thyromegaly present.  Cardiovascular: Normal rate, regular rhythm, normal heart sounds and intact distal pulses.  Exam reveals no gallop.   Pulmonary/Chest: Effort normal and breath sounds normal. No respiratory distress. He has no wheezes.  Abdominal: Soft. Bowel sounds are normal. He exhibits no distension, no abdominal bruit and no mass. There is no tenderness.  Genitourinary: Rectum normal and prostate  normal. Guaiac negative stool.  Musculoskeletal: Normal range of motion. He exhibits no edema and no tenderness.  Lymphadenopathy:    He has no cervical adenopathy.  Neurological: He is alert. He has normal reflexes. No cranial nerve deficit. He exhibits normal muscle tone. Coordination normal.  Skin: Skin is warm and dry. No rash noted. No erythema. No pallor.  Psychiatric: He has a normal mood and affect.          Assessment & Plan:

## 2010-12-26 NOTE — Patient Instructions (Signed)
We will refer you for colonoscopy at check out  Labs today  Keep up a healthy diet and exercise

## 2010-12-26 NOTE — Assessment & Plan Note (Signed)
Labs today for tsh No changes on 13 years  No clinical symptoms  Med refilled

## 2010-12-26 NOTE — Assessment & Plan Note (Signed)
Reviewed health habits including diet and exercise and skin cancer prevention Also reviewed health mt list, fam hx and immunizations  Wellness labs today 

## 2010-12-26 NOTE — Assessment & Plan Note (Signed)
Pt wants to change to [place] for this  Ref for screen colonosc due to fam hx colon cancer (5 years)- no stool changes

## 2010-12-26 NOTE — Assessment & Plan Note (Signed)
Nl prostate exam today Disc sympt of prostate enl with age- very mild  psa today and update

## 2011-01-10 ENCOUNTER — Telehealth: Payer: Self-pay | Admitting: *Deleted

## 2011-01-10 NOTE — Telephone Encounter (Signed)
Patient notified as instructed by telephone. Please see result note. Copy of lab mailed to pt.

## 2011-01-10 NOTE — Telephone Encounter (Signed)
Patient calling requesting his test results from before Thanksgiving and would like a copy mailed to him.

## 2011-01-23 ENCOUNTER — Ambulatory Visit (AMBULATORY_SURGERY_CENTER): Payer: 59 | Admitting: *Deleted

## 2011-01-23 ENCOUNTER — Telehealth: Payer: Self-pay | Admitting: *Deleted

## 2011-01-23 VITALS — Ht 67.0 in | Wt 177.8 lb

## 2011-01-23 DIAGNOSIS — Z800 Family history of malignant neoplasm of digestive organs: Secondary | ICD-10-CM

## 2011-01-23 DIAGNOSIS — Z1211 Encounter for screening for malignant neoplasm of colon: Secondary | ICD-10-CM

## 2011-01-23 MED ORDER — PEG-KCL-NACL-NASULF-NA ASC-C 100 G PO SOLR: ORAL | Status: DC

## 2011-01-23 NOTE — Telephone Encounter (Signed)
Release faxed to [facility] at [phone]

## 2011-01-23 NOTE — Progress Notes (Signed)
Patient states last colonoscopy was 5 years ago with [facility] on [address]. Patient states "normal" results. Patient does have family hx colon cancer and was told to have the exam every 5 years. Release of information form filled out and signed by patient and then given to [facility].

## 2011-01-23 NOTE — Telephone Encounter (Signed)
Patient for colonoscopy with Dr.Gessner on 02-10-11. He states his last colonoscopy was with [facility] 5 years ago with normal results per patient. Patient has family history of colon cancer(father) and was told to have a colonoscopy every 5 years. Release of information form filled out and signed by patient and given to [facility].

## 2011-01-24 ENCOUNTER — Encounter: Payer: Self-pay | Admitting: Internal Medicine

## 2011-01-25 NOTE — Telephone Encounter (Signed)
Records received

## 2011-02-10 ENCOUNTER — Encounter: Payer: Self-pay | Admitting: Internal Medicine

## 2011-02-10 ENCOUNTER — Ambulatory Visit (AMBULATORY_SURGERY_CENTER): Payer: 59 | Admitting: Internal Medicine

## 2011-02-10 VITALS — BP 144/86 | HR 77 | Temp 97.8°F | Resp 12 | Ht 67.0 in | Wt 177.0 lb

## 2011-02-10 DIAGNOSIS — Z1211 Encounter for screening for malignant neoplasm of colon: Secondary | ICD-10-CM

## 2011-02-10 DIAGNOSIS — Z800 Family history of malignant neoplasm of digestive organs: Secondary | ICD-10-CM

## 2011-02-10 MED ORDER — SODIUM CHLORIDE 0.9 % IV SOLN: 500.0000 mL | INTRAVENOUS | Status: DC

## 2011-02-10 NOTE — Patient Instructions (Addendum)
Mild diverticulosis (small pockets) were seen but no polyps or cancer were found. Next routine repeat colonoscopy in about 5 years. Iva Boop, MD, [facility]   Follow discharge instruction sheets (blue & green sheets)

## 2011-02-10 NOTE — Progress Notes (Signed)
Patient did not experience any of the following events: a burn prior to discharge; a fall within the facility; wrong site/side/patient/procedure/implant event; or a hospital transfer or hospital admission upon discharge from the facility. (G8907) Patient did not have preoperative order for IV antibiotic SSI prophylaxis. (G8918)  

## 2011-02-10 NOTE — Op Note (Signed)
[facility] [address] [place], [place]  [address]  COLONOSCOPY PROCEDURE REPORT  PATIENT:  Carlos Larson, Carlos Larson  MR#:  [id number] BIRTHDATE:  13-Jan-1956, 55 yrs. old  GENDER:  male ENDOSCOPIST:  Iva Boop, MD, [facility] REF. BY:  Marne A. Milinda Antis, M.D. PROCEDURE DATE:  02/10/2011 PROCEDURE:  Colonoscopy 19147 ASA CLASS:  Class II INDICATIONS:  Elevated Risk Screening, family history of colon cancer MEDICATIONS:   These medications were titrated to patient response per physician's verbal order, Fentanyl 75 mcg IV, Versed 6 mg IV  DESCRIPTION OF PROCEDURE:   After the risks benefits and alternatives of the procedure were thoroughly explained, informed consent was obtained.  Digital rectal exam was performed and revealed no rectal masses and an enlarged prostate.  Mildly enlarged, no nodules. The LB [id number] endoscope was introduced through the anus and advanced to the cecum, which was identified by both the appendix and ileocecal valve, without limitations.  The quality of the prep was excellent, using MoviPrep.  The instrument was then slowly withdrawn as the colon was fully examined. <<PROCEDUREIMAGES>>  FINDINGS:  Scattered diverticula were found in the sigmoid colon. This was otherwise a normal examination of the colon. Includes right colon retroflexion.   Retroflexed views in the rectum revealed no abnormalities.    The time to cecum = 2:18 minutes. The scope was then withdrawn in 8:57 minutes from the cecum and the procedure completed. COMPLICATIONS:  None ENDOSCOPIC IMPRESSION: 1) Diverticula, scattered in the sigmoid colon 2) Otherwise normal examination, excellent prep 3) Family history of colon cancer (father at 36)  REPEAT EXAM:  In 5 year(s) for routine screening colonoscopy.  Iva Boop, MD, [facility]  CC:  The Patient and Judy Pimple, MD  n. Rosalie Doctor:   Iva Boop at 02/10/2011 11:34 AM  Dagmar Hait, [phone]

## 2011-02-13 ENCOUNTER — Telehealth: Payer: Self-pay | Admitting: *Deleted

## 2011-02-13 NOTE — Telephone Encounter (Signed)
No answer on f/u phone call.  Left message at home # on Voicemail.

## 2011-08-15 ENCOUNTER — Telehealth: Payer: Self-pay | Admitting: Family Medicine

## 2011-08-15 NOTE — Telephone Encounter (Signed)
Looks like she scheduled it in oct - good

## 2011-08-15 NOTE — Telephone Encounter (Signed)
Go ahead and put any two 15 min slots together-thanks

## 2011-08-15 NOTE — Telephone Encounter (Signed)
Left message on patient's home answering machine to call office to make a physical appt in Oct or Nov per Dr. Milinda Antis.  Patient also previously said as long as the physical is in a calendar year the insurance company is ok with that.  His last physical was 12/26/10, but scheduling the next one in Oct of this year would be acceptable.

## 2011-08-15 NOTE — Telephone Encounter (Signed)
Patient needs a physical before 01/07/12 but there are no physical slots available until 01/10/12. Please advise as to your wishes.

## 2011-12-01 ENCOUNTER — Encounter: Payer: Self-pay | Admitting: Family Medicine

## 2011-12-01 ENCOUNTER — Ambulatory Visit (INDEPENDENT_AMBULATORY_CARE_PROVIDER_SITE_OTHER): Payer: 59 | Admitting: Family Medicine

## 2011-12-01 VITALS — BP 106/74 | HR 62 | Temp 98.4°F | Ht 67.0 in | Wt 178.8 lb

## 2011-12-01 DIAGNOSIS — Z125 Encounter for screening for malignant neoplasm of prostate: Secondary | ICD-10-CM

## 2011-12-01 DIAGNOSIS — E039 Hypothyroidism, unspecified: Secondary | ICD-10-CM

## 2011-12-01 DIAGNOSIS — I10 Essential (primary) hypertension: Secondary | ICD-10-CM

## 2011-12-01 DIAGNOSIS — Z0000 Encounter for general adult medical examination without abnormal findings: Secondary | ICD-10-CM

## 2011-12-01 LAB — CBC WITH DIFFERENTIAL/PLATELET
Basophils Relative: 0.4 % (ref 0.0–3.0)
Hemoglobin: 16.1 g/dL (ref 13.0–17.0)
Lymphocytes Relative: 27.8 % (ref 12.0–46.0)
Monocytes Relative: 5.4 % (ref 3.0–12.0)
Neutro Abs: 4.1 10*3/uL (ref 1.4–7.7)
RBC: 5.07 Mil/uL (ref 4.22–5.81)

## 2011-12-01 LAB — COMPREHENSIVE METABOLIC PANEL
ALT: 29 U/L (ref 0–53)
BUN: 13 mg/dL (ref 6–23)
CO2: 30 mEq/L (ref 19–32)
Calcium: 9.5 mg/dL (ref 8.4–10.5)
Chloride: 104 mEq/L (ref 96–112)
Creatinine, Ser: 0.9 mg/dL (ref 0.4–1.5)
GFR: 89.06 mL/min (ref >60.00)
Glucose, Bld: 91 mg/dL (ref 70–99)

## 2011-12-01 LAB — PSA: PSA: 2.19 ng/mL (ref 0.10–4.00)

## 2011-12-01 LAB — LIPID PANEL
Cholesterol: 207 mg/dL — ABNORMAL HIGH (ref 0–200)
Total CHOL/HDL Ratio: 7

## 2011-12-01 NOTE — Progress Notes (Signed)
Subjective:    Patient ID: Carlos Larson, male    DOB: [birth year], 56 y.o.   MRN: [id number]  HPI Here for health maintenance exam and to review chronic medical problems   Is doing well overall   No new medical issues   Wt is up 1 lb with bmi of 27.9  Flu vaccine- already had one , at work -- was in oct mid   Colonoscopy was 1/13 Has fam hx in father  Lab Results  Component Value Date   PSA 2.27 12/26/2010   PSA 1.67 11/24/2009  stream slows down a bit with age  occ nocturia     Hypothyroid- no changes he feels Energy level and skin/ hair are the same   Is getting exercise - hunting with his dogs/ very active   bp is stable today  No cp or palpitations or headaches or edema  No side effects to medicines  BP Readings from Last 3 Encounters:  12/01/11 106/74  02/10/11 144/86  12/26/10 118/78     Needs labs today   Patient Active Problem List  Diagnosis  . HYPOTHYROIDISM  . HYPERTENSION, ESSENTIAL NOS  . Routine general medical examination at a health care facility  . Prostate cancer screening  . Family history of colon cancer   Past Medical History  Diagnosis Date  . HTN (hypertension)   . Hypothyroid   . Pollen allergies     mild   Past Surgical History  Procedure Date  . Tonsillectomy 1971    and adnoids  . Colonoscopy 01/12/2006, 02/10/2011    2007 - Normal (Dr. Bosie Clos) 2013 - mild diverticulosis Leone Payor)   History  Substance Use Topics  . Smoking status: Former Smoker    Quit date: 02/07/1972  . Smokeless tobacco: Never Used   Comment: Smoked for 4 years  . Alcohol Use: No   Family History  Problem Relation Age of Onset  . Colon cancer Father 43  . Hypertension Father   . Heart disease Mother     valvular disease  . Hypertension Mother   . Prostate cancer Maternal Grandfather   . Hypertension Paternal Grandfather   . Coronary artery disease Maternal Grandmother   . Diabetes Neg Hx    No Known Allergies Current Outpatient  Prescriptions on File Prior to Visit  Medication Sig Dispense Refill  . benazepril-hydrochlorthiazide (LOTENSIN HCT) 10-12.5 MG per tablet Take 1 tablet by mouth daily.  90 tablet  3  . calcium carbonate (OS-CAL) 600 MG TABS Take 600 mg by mouth daily.      . cholecalciferol (VITAMIN D) 1000 UNITS tablet Take 1,000 Units by mouth daily.        Marland Kitchen levothyroxine (SYNTHROID, LEVOTHROID) 100 MCG tablet Take 1 tablet (100 mcg total) by mouth daily.  90 tablet  3  . Omega-3 Fatty Acids (FISH OIL) 1200 MG CAPS Take 1 capsule by mouth daily.           Review of Systems Review of Systems  Constitutional: Negative for fever, appetite change, fatigue and unexpected weight change.  Eyes: Negative for pain and visual disturbance.  ENT pos for some nasal congestion from ragweed  Respiratory: Negative for cough and shortness of breath.   Cardiovascular: Negative for cp or palpitations    Gastrointestinal: Negative for nausea, diarrhea and constipation.  Genitourinary: Negative for urgency and frequency.  Skin: Negative for pallor or rash   Neurological: Negative for weakness, light-headedness, numbness and headaches.  Hematological: Negative for adenopathy. Does  not bruise/bleed easily.  Psychiatric/Behavioral: Negative for dysphoric mood. The patient is not nervous/anxious.         Objective:   Physical Exam  Constitutional: He appears well-developed and well-nourished. No distress.  HENT:  Head: Normocephalic and atraumatic.  Right Ear: External ear normal.  Left Ear: External ear normal.  Nose: Nose normal.  Mouth/Throat: Oropharynx is clear and moist.       Nares are boggy  Eyes: Conjunctivae normal and EOM are normal. Pupils are equal, round, and reactive to light. No scleral icterus.  Neck: Normal range of motion. Neck supple. No JVD present. Carotid bruit is not present. No thyromegaly present.  Cardiovascular: Normal rate, regular rhythm, normal heart sounds and intact distal pulses.   Exam reveals no gallop.   Pulmonary/Chest: Effort normal and breath sounds normal. No respiratory distress. He has no wheezes. He exhibits no tenderness.  Abdominal: Soft. Bowel sounds are normal. He exhibits no distension, no abdominal bruit and no mass. There is no tenderness.  Genitourinary: Prostate is not enlarged and not tender.  Musculoskeletal: He exhibits no edema and no tenderness.  Lymphadenopathy:    He has no cervical adenopathy.  Neurological: He is alert. He has normal reflexes. No cranial nerve deficit. He exhibits normal muscle tone. Coordination normal.  Skin: Skin is warm and dry. No rash noted. No erythema. No pallor.  Psychiatric: He has a normal mood and affect.          Assessment & Plan:

## 2011-12-01 NOTE — Assessment & Plan Note (Signed)
Reviewed health habits including diet and exercise and skin cancer prevention Also reviewed health mt list, fam hx and immunizations  Wellness labs today Had his flu vaccine at work

## 2011-12-01 NOTE — Patient Instructions (Addendum)
Labs today  Blood pressure is good Try to stay healthy with good diet and exercise  I'm glad you had your flu vaccine

## 2011-12-01 NOTE — Assessment & Plan Note (Signed)
Hypothyroidism  Pt has no clinical changes No change in energy level/ hair or skin/ edema and no tremor Lab Results  Component Value Date   TSH 0.84 12/26/2010    Labs today for tsh

## 2011-12-01 NOTE — Assessment & Plan Note (Signed)
bp in fair control at this time  No changes needed  Disc lifstyle change with low sodium diet and exercise  Lab today 

## 2011-12-01 NOTE — Assessment & Plan Note (Signed)
DRE normal  psa today Remote family hx

## 2011-12-19 ENCOUNTER — Telehealth: Payer: Self-pay

## 2011-12-19 NOTE — Telephone Encounter (Signed)
Overall labs are stable  Chol is up a bit so watch diet - Avoid red meat/ fried foods/ egg yolks/ fatty breakfast meats/ butter, cheese and high fat dairy/ and shellfish   Also cr (kidney number) up slightly so try to drink more water  psa nl  Thyroid ok  No changes needed Will disc further at f/u

## 2011-12-19 NOTE — Telephone Encounter (Signed)
Pt does not have appt scheduled until next year and request call back with 11/2011 lab results.

## 2011-12-19 NOTE — Telephone Encounter (Signed)
Left voicemail requesting pt to call office, will try to call back later 

## 2011-12-19 NOTE — Telephone Encounter (Signed)
Pt notified and a copy of labs mailed to him per pt request

## 2011-12-27 ENCOUNTER — Other Ambulatory Visit: Payer: Self-pay | Admitting: Family Medicine

## 2012-04-22 ENCOUNTER — Telehealth: Payer: Self-pay | Admitting: Family Medicine

## 2012-04-22 NOTE — Telephone Encounter (Signed)
Patient Information:  Caller Name: Crystian  Phone: [phone]  Patient: Carlos Larson, Carlos Larson  Gender: Male  DOB: 07-Aug-1955  Age: 57 Years  PCP: Roxy Manns [facility])  Office Follow Up:  Does the office need to follow up with this patient?: No  Instructions For The Office: N/A   Symptoms  Reason For Call & Symptoms: 04/17/12 sx started that night, started with N&V and diarrhea,  04/18/12 nauseated all day and took Pepto Bismol.  The diarrhea has been intermittent.  He started taking immodium 04/20/12.  04/21/12 diarrhea continues and he was able to sleep.  Afebrile. Pt has voided x 2 this AM.  Reviewed Health History In EMR: Yes  Reviewed Medications In EMR: Yes  Reviewed Allergies In EMR: No  Reviewed Surgeries / Procedures: No  Date of Onset of Symptoms: 04/17/2012  Treatments Tried: Pepto Bismol and Immondium  Treatments Tried Worked: No  Guideline(s) Used:  Diarrhea  Disposition Per Guideline:   Home Care  Reason For Disposition Reached:   Mild diarrhea  Advice Given:  Reassurance:  In healthy adults, new-onset diarrhea is usually caused by a viral infection of the intestines, which you can treat at home. Diarrhea is the body's way of getting rid of the infection. Here are some tips on how to keep ahead of the fluid losses.  Fluids:  Drink more fluids, at least 8-10 glasses (8 oz or 240 ml) daily.  For example: sports drinks, diluted fruit juices, soft drinks.  Supplement this with saltine crackers or soups to make certain that you are getting sufficient fluid and salt to meet your body's needs.  Fluids:  Drink more fluids, at least 8-10 glasses (8 oz or 240 ml) daily.  For example: sports drinks, diluted fruit juices, soft drinks.  Supplement this with saltine crackers or soups to make certain that you are getting sufficient fluid and salt to meet your body's needs.  Avoid caffeinated beverages (Reason: caffeine is mildly dehydrating).  Nutrition:  Ideal initial  foods include boiled starches/cereals (e.g., potatoes, rice, noodles, wheat, oats) with a small amount of salt to taste.  Nutrition:  Ideal initial foods include boiled starches/cereals (e.g., potatoes, rice, noodles, wheat, oats) with a small amount of salt to taste.  Other acceptable foods include: bananas, yogurt, crackers, soup.   As your stools return to normal consistency, resume a normal diet.  Expected Course:  Viral diarrhea lasts 4-7 days. Always worse on days 1 and 2.  Patient Will Follow Care Advice:  YES

## 2012-10-23 ENCOUNTER — Telehealth: Payer: Self-pay

## 2012-10-23 NOTE — Telephone Encounter (Signed)
I will actually do the ua on the day of the physical so I can look at it at that time - tell pt to remind me when he comes

## 2012-10-23 NOTE — Telephone Encounter (Signed)
Pt left v/m requesting cb about his upcoming lab test. 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 Pt said he is having no problems at all; no abd or back pain, no fever, no burning or pain upon urination and no frequecy. Pt wanted to know if should have urinalysis when comes for CPX lab on 11/25/12; pt scheduled CPX 12/02/12. Please advise.

## 2012-10-24 NOTE — Telephone Encounter (Signed)
Left voicemail letting pt know we will do ua day of physical

## 2012-11-24 ENCOUNTER — Telehealth: Payer: Self-pay | Admitting: Family Medicine

## 2012-11-24 DIAGNOSIS — Z125 Encounter for screening for malignant neoplasm of prostate: Secondary | ICD-10-CM

## 2012-11-24 DIAGNOSIS — Z0000 Encounter for general adult medical examination without abnormal findings: Secondary | ICD-10-CM

## 2012-11-24 NOTE — Telephone Encounter (Signed)
Message copied by Judy Pimple on Sun Nov 24, 2012  1:16 PM ------      Message from: Alvina Chou      Created: Fri Nov 15, 2012  3:32 PM      Regarding: Lab orders for Monday, 10.20.14       Patient is scheduled for CPX labs, please order future labs, Thanks , Terri       ------

## 2012-11-25 ENCOUNTER — Other Ambulatory Visit (INDEPENDENT_AMBULATORY_CARE_PROVIDER_SITE_OTHER): Payer: 59

## 2012-11-25 DIAGNOSIS — I10 Essential (primary) hypertension: Secondary | ICD-10-CM

## 2012-11-25 DIAGNOSIS — Z0000 Encounter for general adult medical examination without abnormal findings: Secondary | ICD-10-CM

## 2012-11-25 DIAGNOSIS — E039 Hypothyroidism, unspecified: Secondary | ICD-10-CM

## 2012-11-25 DIAGNOSIS — Z125 Encounter for screening for malignant neoplasm of prostate: Secondary | ICD-10-CM

## 2012-11-25 LAB — COMPREHENSIVE METABOLIC PANEL
Albumin: 4.3 g/dL (ref 3.5–5.2)
CO2: 30 mEq/L (ref 19–32)
Calcium: 9.6 mg/dL (ref 8.4–10.5)
Chloride: 103 mEq/L (ref 96–112)
Creatinine, Ser: 1 mg/dL (ref 0.4–1.5)
GFR: 85.56 mL/min (ref >60.00)
Glucose, Bld: 109 mg/dL — ABNORMAL HIGH (ref 70–99)
Sodium: 139 mEq/L (ref 135–145)
Total Bilirubin: 1.3 mg/dL — ABNORMAL HIGH (ref 0.3–1.2)
Total Protein: 6.8 g/dL (ref 6.0–8.3)

## 2012-11-25 LAB — LIPID PANEL
Cholesterol: 158 mg/dL (ref 0–200)
VLDL: 40.8 mg/dL — ABNORMAL HIGH (ref 0.0–40.0)

## 2012-11-25 LAB — CBC WITH DIFFERENTIAL/PLATELET
Basophils Absolute: 0 10*3/uL (ref 0.0–0.1)
Eosinophils Relative: 1.8 % (ref 0.0–5.0)
HCT: 45.9 % (ref 39.0–52.0)
Hemoglobin: 16.1 g/dL (ref 13.0–17.0)
Lymphs Abs: 1.8 10*3/uL (ref 0.7–4.0)
MCHC: 35 g/dL (ref 30.0–36.0)
Monocytes Relative: 5.2 % (ref 3.0–12.0)
Neutro Abs: 5.2 10*3/uL (ref 1.4–7.7)
RDW: 13.1 % (ref 11.5–14.6)
WBC: 7.6 10*3/uL (ref 4.5–10.5)

## 2012-11-25 LAB — LDL CHOLESTEROL, DIRECT: Direct LDL: 80.4 mg/dL

## 2012-12-02 ENCOUNTER — Encounter: Payer: Self-pay | Admitting: Family Medicine

## 2012-12-02 ENCOUNTER — Ambulatory Visit (INDEPENDENT_AMBULATORY_CARE_PROVIDER_SITE_OTHER): Payer: 59 | Admitting: Family Medicine

## 2012-12-02 VITALS — BP 108/74 | HR 72 | Temp 98.2°F | Ht 67.0 in | Wt 179.8 lb

## 2012-12-02 DIAGNOSIS — M79609 Pain in unspecified limb: Secondary | ICD-10-CM

## 2012-12-02 DIAGNOSIS — Z125 Encounter for screening for malignant neoplasm of prostate: Secondary | ICD-10-CM

## 2012-12-02 DIAGNOSIS — I10 Essential (primary) hypertension: Secondary | ICD-10-CM

## 2012-12-02 DIAGNOSIS — Z0000 Encounter for general adult medical examination without abnormal findings: Secondary | ICD-10-CM

## 2012-12-02 DIAGNOSIS — E039 Hypothyroidism, unspecified: Secondary | ICD-10-CM

## 2012-12-02 DIAGNOSIS — M79644 Pain in right finger(s): Secondary | ICD-10-CM | POA: Insufficient documentation

## 2012-12-02 DIAGNOSIS — R319 Hematuria, unspecified: Secondary | ICD-10-CM

## 2012-12-02 DIAGNOSIS — Z800 Family history of malignant neoplasm of digestive organs: Secondary | ICD-10-CM

## 2012-12-02 LAB — POCT URINALYSIS DIPSTICK
Bilirubin, UA: NEGATIVE
Glucose, UA: NEGATIVE
Leukocytes, UA: NEGATIVE
Nitrite, UA: NEGATIVE
Urobilinogen, UA: 0.2

## 2012-12-02 MED ORDER — LEVOTHYROXINE SODIUM 100 MCG PO TABS: 100.0000 ug | ORAL_TABLET | Freq: Every day | ORAL | Status: DC

## 2012-12-02 MED ORDER — BENAZEPRIL-HYDROCHLOROTHIAZIDE 10-12.5 MG PO TABS: 1.0000 | ORAL_TABLET | Freq: Every day | ORAL | Status: DC

## 2012-12-02 NOTE — Assessment & Plan Note (Signed)
utd colonoscopy 2013

## 2012-12-02 NOTE — Patient Instructions (Signed)
If your thumb pain worsens make an appt here with Dr Patsy Lager in this office  We will refer you for urology visit at check out -for the blood in urine  Take care yourself  Avoid red meat/ fried foods/ egg yolks/ fatty breakfast meats/ butter, cheese and high fat dairy/ and shellfish   Also watch sugar in diet - try to drink more water

## 2012-12-02 NOTE — Assessment & Plan Note (Signed)
Stiff and sore -no deformity Will call for appt with Dr Patsy Lager for further eval

## 2012-12-02 NOTE — Assessment & Plan Note (Signed)
Hypothyroidism  Pt has no clinical changes No change in energy level/ hair or skin/ edema and no tremor Lab Results  Component Value Date   TSH 1.91 11/25/2012

## 2012-12-02 NOTE — Assessment & Plan Note (Signed)
Lab Results  Component Value Date   PSA 3.07 11/25/2012   PSA 2.19 12/01/2011   PSA 2.27 12/26/2010   Disc symptoms/ nocturia urol f/u for this and hematuria

## 2012-12-02 NOTE — Progress Notes (Signed)
Subjective:    Patient ID: Carlos Larson, male    DOB: 06-14-1955, 57 y.o.   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 If he could get shingles vaccine   colonosc 1/13 5 y recall due to fam hx   Td 7/11  Prostate screening  Lab Results  Component Value Date   PSA 3.07 11/25/2012   PSA 2.19 12/01/2011   PSA 2.27 12/26/2010   nocturia varies - between 0-3 times  No trouble getting stream going Most of the time- except occasionally  GF on mothers side had prostate cancer  Also a great uncle  No blood in urine visible    Hypothyroidism  Pt has no clinical changes No change in energy level/ hair or skin/ edema and no tremor Lab Results  Component Value Date   TSH 1.91 11/25/2012      bp is stable today  No cp or palpitations or headaches or edema  No side effects to medicines  BP Readings from Last 3 Encounters:  12/02/12 108/74  12/01/11 106/74  02/10/11 144/86      Hyperlipidemia Lab Results  Component Value Date   CHOL 158 11/25/2012   CHOL 207* 12/01/2011   CHOL 205* 12/26/2010   Lab Results  Component Value Date   HDL 28.30* 11/25/2012   HDL 30.40* 12/01/2011   HDL 35.90* 12/26/2010   Lab Results  Component Value Date   LDLCALC 134* 11/24/2009   Lab Results  Component Value Date   TRIG 204.0* 11/25/2012   TRIG 84.0 12/01/2011   TRIG 114.0 12/26/2010   Lab Results  Component Value Date   CHOLHDL 6 11/25/2012   CHOLHDL 7 12/01/2011   CHOLHDL 6 12/26/2010   Lab Results  Component Value Date   LDLDIRECT 80.4 11/25/2012   LDLDIRECT 156.2 12/01/2011   LDLDIRECT 146.3 12/26/2010    Stopped his fish oil habit - not as much exercise since is wife has  cancer - a lot of caregiver  Mood is prettty good -not depressed   Patient Active Problem List   Diagnosis Date Noted  . Routine general medical examination at a health care facility 12/26/2010  . Prostate cancer screening 12/26/2010  . Family history of colon cancer 12/26/2010  . HYPOTHYROIDISM 06/22/2009  . HYPERTENSION, ESSENTIAL NOS 06/22/2009   Past Medical History  Diagnosis Date  . HTN (hypertension)   . Hypothyroid   . Pollen allergies     mild   Past Surgical History  Procedure Laterality Date  . Tonsillectomy  1971    and adnoids  . Colonoscopy  01/12/2006, 02/10/2011    2007 - Normal (Dr. Bosie Clos) 2013 - mild diverticulosis Leone Payor)   History  Substance Use Topics  . Smoking status: Former Smoker    Quit date: 02/07/1972  . Smokeless tobacco: Never Used     Comment: Smoked for 4 years  . Alcohol Use: No   Family History  Problem Relation Age of Onset  . Colon cancer Father 30  . Hypertension Father   . Heart disease Mother     valvular disease  . Hypertension Mother   . Prostate  cancer Maternal Grandfather   . Hypertension Paternal Grandfather   . Coronary artery disease Maternal Grandmother   . Diabetes Neg Hx    No Known Allergies Current Outpatient Prescriptions on File Prior to Visit  Medication Sig Dispense Refill  . benazepril-hydrochlorthiazide (LOTENSIN HCT) 10-12.5 MG per tablet TAKE 1 TABLET BY MOUTH DAILY.  90 tablet  3  . cholecalciferol (VITAMIN D) 1000 UNITS tablet Take 1,000 Units by mouth daily.        Marland Kitchen levothyroxine (SYNTHROID, LEVOTHROID) 100 MCG tablet TAKE 1 TABLET BY MOUTH EVERY DAY  90 tablet  3   No current facility-administered medications on file prior to visit.     Review of Systems Review of Systems  Constitutional: Negative for fever, appetite change, fatigue and unexpected weight change.  Eyes: Negative for pain and visual disturbance.  Respiratory: Negative for cough and shortness of breath.   Cardiovascular:  Negative for cp or palpitations    Gastrointestinal: Negative for nausea, diarrhea and constipation.  Genitourinary: Negative for urgency and frequency.  Skin: Negative for pallor or rash  MSK pos for R thumb pain and loss of mobility  Neurological: Negative for weakness, light-headedness, numbness and headaches.  Hematological: Negative for adenopathy. Does not bruise/bleed easily.  Psychiatric/Behavioral: Negative for dysphoric mood. The patient is not nervous/anxious.          Objective:   Physical Exam  Constitutional: He appears well-developed and well-nourished. No distress.  HENT:  Head: Normocephalic and atraumatic.  Right Ear: External ear normal.  Left Ear: External ear normal.  Nose: Nose normal.  Mouth/Throat: Oropharynx is clear and moist.  Eyes: Conjunctivae and EOM are normal. Pupils are equal, round, and reactive to light. Right eye exhibits no discharge. Left eye exhibits no discharge. No scleral icterus.  Neck: Normal range of motion. Neck supple. No JVD present. Carotid bruit is not present. No thyromegaly present.  Cardiovascular: Normal rate, regular rhythm, normal heart sounds and intact distal pulses.  Exam reveals no gallop.   Pulmonary/Chest: Effort normal and breath sounds normal. No respiratory distress. He has no wheezes. He exhibits no tenderness.  Abdominal: Soft. Bowel sounds are normal. He exhibits no distension, no abdominal bruit and no mass. There is no tenderness.  No cva tenderness   Genitourinary: Rectum normal. Prostate is not tender.  Prostate smooth and nt and firm slt enl  Musculoskeletal: He exhibits tenderness. He exhibits no edema.  Limited rom of R thumb-distal PIP joint and tenderness at MCP joint without deformity  Lymphadenopathy:    He has no cervical adenopathy.  Neurological: He is alert. He has normal reflexes. No cranial nerve deficit. He exhibits normal muscle tone. Coordination normal.  Skin: Skin is warm and dry. No rash  noted. No erythema. No pallor.  Dry skin   Psychiatric: He has a normal mood and affect.  Somewhat of a blunted affect           Assessment & Plan:

## 2012-12-02 NOTE — Assessment & Plan Note (Signed)
Mod blood on dip times 2  fam hx of kidney stones No pain or other symptoms  Ref to [facility]

## 2012-12-02 NOTE — Assessment & Plan Note (Signed)
bp in fair control at this time  No changes needed  Disc lifstyle change with low sodium diet and exercise  Labs reviewed  

## 2012-12-02 NOTE — Assessment & Plan Note (Signed)
Reviewed health habits including diet and exercise and skin cancer prevention Also reviewed health mt list, fam hx and immunizations   Wellness labs reviewed  

## 2013-05-20 ENCOUNTER — Encounter: Payer: Self-pay | Admitting: Internal Medicine

## 2013-05-20 ENCOUNTER — Ambulatory Visit (INDEPENDENT_AMBULATORY_CARE_PROVIDER_SITE_OTHER): Payer: 59 | Admitting: Internal Medicine

## 2013-05-20 VITALS — BP 120/78 | HR 67 | Temp 97.9°F | Wt 180.0 lb

## 2013-05-20 DIAGNOSIS — R42 Dizziness and giddiness: Secondary | ICD-10-CM

## 2013-05-20 DIAGNOSIS — H9319 Tinnitus, unspecified ear: Secondary | ICD-10-CM

## 2013-05-20 MED ORDER — MECLIZINE HCL 50 MG PO TABS: 25.0000 mg | ORAL_TABLET | Freq: Three times a day (TID) | ORAL | Status: DC | PRN

## 2013-05-20 NOTE — Progress Notes (Signed)
Pre visit review using our clinic review tool, if applicable. No additional management support is needed unless otherwise documented below in the visit note. 

## 2013-05-20 NOTE — Patient Instructions (Addendum)

## 2013-05-21 ENCOUNTER — Encounter: Payer: Self-pay | Admitting: Internal Medicine

## 2013-05-21 NOTE — Progress Notes (Signed)
Subjective:    Patient ID: Carlos Larson, male    DOB: [birth year], 58 y.o.   MRN: [id number]  HPI  Pt presents to the clinic today with c/o a mild ringing in his ears and feeling off balance. He reports this started 1 week ago. He denies ear pain, but has had some headache and nausea, which now has resolved. He denies fever, chills, body aches or sinus symptoms. He did try flonase OTC. He denies chest pain, chest tightness or shortness of breath.  Review of Systems      Past Medical History  Diagnosis Date  . HTN (hypertension)   . Hypothyroid   . Pollen allergies     mild    Current Outpatient Prescriptions  Medication Sig Dispense Refill  . benazepril-hydrochlorthiazide (LOTENSIN HCT) 10-12.5 MG per tablet Take 1 tablet by mouth daily.  90 tablet  3  . cholecalciferol (VITAMIN D) 1000 UNITS tablet Take 1,000 Units by mouth daily.        Marland Kitchen levothyroxine (SYNTHROID, LEVOTHROID) 100 MCG tablet Take 1 tablet (100 mcg total) by mouth daily before breakfast.  90 tablet  3  . meclizine (ANTIVERT) 50 MG tablet Take 0.5 tablets (25 mg total) by mouth 3 (three) times daily as needed.  30 tablet  0   No current facility-administered medications for this visit.    No Known Allergies  Family History  Problem Relation Age of Onset  . Colon cancer Father 60  . Hypertension Father   . Heart disease Mother     valvular disease  . Hypertension Mother   . Prostate cancer Maternal Grandfather   . Hypertension Paternal Grandfather   . Coronary artery disease Maternal Grandmother   . Diabetes Neg Hx     History   Social History  . Marital Status: Single    Spouse Name: N/A    Number of Children: N/A  . Years of Education: N/A   Occupational History  . Lineman [employer]   Social History Main Topics  . Smoking status: Former Smoker    Quit date: 02/07/1972  . Smokeless tobacco: Never Used     Comment: Smoked for 4 years  . Alcohol Use: No  . Drug Use: No  . Sexual  Activity: Not on file   Other Topics Concern  . Not on file   Social History Narrative   Married      Regular exercise (lifts weights)      Lineman for [employer]     Constitutional: Denies fever, malaise, fatigue, headache or abrupt weight changes.  HEENT: Pt reports ringing in the ears. Denies eye pain, eye redness, ear pain, wax buildup, runny nose, nasal congestion, bloody nose, or sore throat. Respiratory: Denies difficulty breathing, shortness of breath, cough or sputum production.   Cardiovascular: Denies chest pain, chest tightness, palpitations or swelling in the hands or feet.  Neurological: Pt reports feeling off balance. Denies difficulty with memory, difficulty with speech or problems with coordination.   No other specific complaints in a complete review of systems (except as listed in HPI above).  Objective:   Physical Exam   BP 120/78  Pulse 67  Temp(Src) 97.9 F (36.6 C) (Oral)  Wt 180 lb (81.647 kg)  SpO2 98% Wt Readings from Last 3 Encounters:  05/20/13 180 lb (81.647 kg)  12/02/12 179 lb 12 oz (81.534 kg)  12/01/11 178 lb 12 oz (81.08 kg)    General: Appears his stated age, well developed, well  nourished in NAD. HEENT: Head: normal shape and size; Eyes: sclera white, no icterus, conjunctiva pink, PERRLA and EOMs intact; Ears: Tm's gray and intact, normal light reflex; Nose: mucosa pink and moist, septum midline; Throat/Mouth: Teeth present, mucosa pink and moist, no exudate, lesions or ulcerations noted.  Cardiovascular: Normal rate and rhythm. S1,S2 noted.  No murmur, rubs or gallops noted. No JVD or BLE edema. No carotid bruits noted. Pulmonary/Chest: Normal effort and positive vesicular breath sounds. No respiratory distress. No wheezes, rales or ronchi noted.  Neurological: Alert and oriented. Negative Rhomberg. Coordination normal. +DTRs bilaterally.   BMET    Component Value Date/Time   NA 139 11/25/2012 0746   K 3.7 11/25/2012 0746   CL 103  11/25/2012 0746   CO2 30 11/25/2012 0746   GLUCOSE 109* 11/25/2012 0746   BUN 10 11/25/2012 0746   CREATININE 1.0 11/25/2012 0746   CALCIUM 9.6 11/25/2012 0746   GFRNONAA 86.49 11/24/2009 1010    Lipid Panel     Component Value Date/Time   CHOL 158 11/25/2012 0746   TRIG 204.0* 11/25/2012 0746   HDL 28.30* 11/25/2012 0746   CHOLHDL 6 11/25/2012 0746   VLDL 40.8* 11/25/2012 0746   LDLCALC 134* 11/24/2009 1010    CBC    Component Value Date/Time   WBC 7.6 11/25/2012 0746   RBC 5.01 11/25/2012 0746   HGB 16.1 11/25/2012 0746   HCT 45.9 11/25/2012 0746   PLT 238.0 11/25/2012 0746   MCV 91.6 11/25/2012 0746   MCHC 35.0 11/25/2012 0746   RDW 13.1 11/25/2012 0746   LYMPHSABS 1.8 11/25/2012 0746   MONOABS 0.4 11/25/2012 0746   EOSABS 0.1 11/25/2012 0746   BASOSABS 0.0 11/25/2012 0746    Hgb A1C No results found for this basename: HGBA1C        Assessment & Plan:   Lightheadedness:  Negative orthostatics OK to continue flonase Rx for Meclizine 25 mg TID prn Drink plenty of fluids to avoid dehydration  RTC as needed or if symptoms persist or worsen

## 2013-12-10 ENCOUNTER — Other Ambulatory Visit: Payer: Self-pay | Admitting: Family Medicine

## 2013-12-11 ENCOUNTER — Other Ambulatory Visit: Payer: Self-pay | Admitting: *Deleted

## 2013-12-11 MED ORDER — BENAZEPRIL-HYDROCHLOROTHIAZIDE 10-12.5 MG PO TABS: 1.0000 | ORAL_TABLET | Freq: Every day | ORAL | Status: DC

## 2013-12-23 ENCOUNTER — Telehealth: Payer: Self-pay | Admitting: Family Medicine

## 2013-12-23 DIAGNOSIS — Z0000 Encounter for general adult medical examination without abnormal findings: Secondary | ICD-10-CM

## 2013-12-23 DIAGNOSIS — Z125 Encounter for screening for malignant neoplasm of prostate: Secondary | ICD-10-CM

## 2013-12-23 NOTE — Telephone Encounter (Signed)
-----   Message from Ellamae Sia sent at 12/18/2013  3:39 PM EST ----- Regarding: Lab orders for Wednesday, 11.18.15 Patient is scheduled for CPX labs, please order future labs, Thanks , Karna Christmas

## 2013-12-24 ENCOUNTER — Other Ambulatory Visit (INDEPENDENT_AMBULATORY_CARE_PROVIDER_SITE_OTHER): Payer: 59

## 2013-12-24 DIAGNOSIS — Z0000 Encounter for general adult medical examination without abnormal findings: Secondary | ICD-10-CM

## 2013-12-24 DIAGNOSIS — Z125 Encounter for screening for malignant neoplasm of prostate: Secondary | ICD-10-CM

## 2013-12-24 LAB — CBC WITH DIFFERENTIAL/PLATELET
BASOS PCT: 0.4 % (ref 0.0–3.0)
Basophils Absolute: 0 10*3/uL (ref 0.0–0.1)
EOS PCT: 2.5 % (ref 0.0–5.0)
Eosinophils Absolute: 0.2 10*3/uL (ref 0.0–0.7)
HEMATOCRIT: 49.1 % (ref 39.0–52.0)
Hemoglobin: 16.7 g/dL (ref 13.0–17.0)
LYMPHS ABS: 1.7 10*3/uL (ref 0.7–4.0)
Lymphocytes Relative: 25.3 % (ref 12.0–46.0)
MCHC: 34 g/dL (ref 30.0–36.0)
MCV: 93.7 fl (ref 78.0–100.0)
MONO ABS: 0.4 10*3/uL (ref 0.1–1.0)
Monocytes Relative: 6 % (ref 3.0–12.0)
Neutro Abs: 4.5 10*3/uL (ref 1.4–7.7)
Neutrophils Relative %: 65.8 % (ref 43.0–77.0)
Platelets: 206 10*3/uL (ref 150.0–400.0)
RBC: 5.24 Mil/uL (ref 4.22–5.81)
RDW: 13.4 % (ref 11.5–15.5)
WBC: 6.8 10*3/uL (ref 4.0–10.5)

## 2013-12-24 LAB — COMPREHENSIVE METABOLIC PANEL
ALBUMIN: 4.7 g/dL (ref 3.5–5.2)
ALK PHOS: 71 U/L (ref 39–117)
ALT: 26 U/L (ref 0–53)
AST: 25 U/L (ref 0–37)
BILIRUBIN TOTAL: 1.3 mg/dL — AB (ref 0.2–1.2)
BUN: 13 mg/dL (ref 6–23)
CO2: 29 mEq/L (ref 19–32)
Calcium: 10 mg/dL (ref 8.4–10.5)
Chloride: 103 mEq/L (ref 96–112)
Creatinine, Ser: 1 mg/dL (ref 0.4–1.5)
GFR: 84.23 mL/min (ref >60.00)
GLUCOSE: 104 mg/dL — AB (ref 70–99)
POTASSIUM: 4.6 meq/L (ref 3.5–5.1)
Sodium: 140 mEq/L (ref 135–145)
Total Protein: 7 g/dL (ref 6.0–8.3)

## 2013-12-24 LAB — LIPID PANEL
CHOLESTEROL: 226 mg/dL — AB (ref 0–200)
HDL: 36 mg/dL — ABNORMAL LOW (ref >39.00)
LDL CALC: 172 mg/dL — AB (ref 0–99)
NonHDL: 190
TRIGLYCERIDES: 90 mg/dL (ref 0.0–149.0)
Total CHOL/HDL Ratio: 6
VLDL: 18 mg/dL (ref 0.0–40.0)

## 2013-12-24 LAB — TSH: TSH: 0.39 u[IU]/mL (ref 0.35–4.50)

## 2013-12-24 LAB — PSA: PSA: 2.16 ng/mL (ref 0.10–4.00)

## 2013-12-31 ENCOUNTER — Encounter: Payer: 59 | Admitting: Family Medicine

## 2014-01-12 ENCOUNTER — Ambulatory Visit (INDEPENDENT_AMBULATORY_CARE_PROVIDER_SITE_OTHER): Payer: 59 | Admitting: Podiatry

## 2014-01-12 ENCOUNTER — Ambulatory Visit (INDEPENDENT_AMBULATORY_CARE_PROVIDER_SITE_OTHER): Payer: 59

## 2014-01-12 ENCOUNTER — Encounter: Payer: Self-pay | Admitting: Family Medicine

## 2014-01-12 ENCOUNTER — Encounter: Payer: Self-pay | Admitting: Podiatry

## 2014-01-12 ENCOUNTER — Ambulatory Visit (INDEPENDENT_AMBULATORY_CARE_PROVIDER_SITE_OTHER): Payer: 59 | Admitting: Family Medicine

## 2014-01-12 VITALS — BP 111/67 | HR 67 | Resp 16 | Ht 67.0 in | Wt 179.0 lb

## 2014-01-12 VITALS — BP 124/82 | HR 72 | Temp 98.3°F | Ht 67.75 in | Wt 179.0 lb

## 2014-01-12 DIAGNOSIS — Z125 Encounter for screening for malignant neoplasm of prostate: Secondary | ICD-10-CM

## 2014-01-12 DIAGNOSIS — M722 Plantar fascial fibromatosis: Secondary | ICD-10-CM

## 2014-01-12 DIAGNOSIS — E785 Hyperlipidemia, unspecified: Secondary | ICD-10-CM

## 2014-01-12 DIAGNOSIS — I10 Essential (primary) hypertension: Secondary | ICD-10-CM

## 2014-01-12 DIAGNOSIS — Z800 Family history of malignant neoplasm of digestive organs: Secondary | ICD-10-CM

## 2014-01-12 DIAGNOSIS — E039 Hypothyroidism, unspecified: Secondary | ICD-10-CM

## 2014-01-12 DIAGNOSIS — Z0000 Encounter for general adult medical examination without abnormal findings: Secondary | ICD-10-CM

## 2014-01-12 MED ORDER — MELOXICAM 15 MG PO TABS: 15.0000 mg | ORAL_TABLET | Freq: Every day | ORAL | Status: DC

## 2014-01-12 MED ORDER — LEVOTHYROXINE SODIUM 100 MCG PO TABS: ORAL_TABLET | ORAL | Status: DC

## 2014-01-12 MED ORDER — BENAZEPRIL-HYDROCHLOROTHIAZIDE 10-12.5 MG PO TABS: 1.0000 | ORAL_TABLET | Freq: Every day | ORAL | Status: DC

## 2014-01-12 MED ORDER — METHYLPREDNISOLONE (PAK) 4 MG PO TABS: ORAL_TABLET | ORAL | Status: DC

## 2014-01-12 NOTE — Assessment & Plan Note (Signed)
bp in fair control at this time  BP Readings from Last 1 Encounters:  01/12/14 124/82   No changes needed Disc lifstyle change with low sodium diet and exercise  Lab rev Med refilled

## 2014-01-12 NOTE — Progress Notes (Signed)
Pre visit review using our clinic review tool, if applicable. No additional management support is needed unless otherwise documented below in the visit note. 

## 2014-01-12 NOTE — Assessment & Plan Note (Signed)
Cholesterol is way up  LDL in 170s Pt thinks this is from eating fried foods Disc goals for lipids and reasons to control them Rev labs with pt Rev low sat fat diet in detail  Handout on diet given

## 2014-01-12 NOTE — Progress Notes (Addendum)
   Subjective:    Patient ID: Carlos Larson, male    DOB: 1955/11/15, 58 y.o.   MRN: [id number]  HPI Comments: Right plantar heel pain , that has been going on for about a year now. It hurts in the morning and eases off during day . After rest the first step down is the worse. The left heel started a little bit this weekend .  No treatment for either foot   Foot Pain      Review of Systems  All other systems reviewed and are negative.      Objective:   Physical Exam: I have reviewed his past medical history medications allergies surgery social history and review of systems. Pulses are strongly palpable bilateral. Neurologic sensorium is intact percent once the monofilament. Deep tendon reflexes are intact bilateral muscle strength is 5 over 5 dorsiflexion plantar flexors and inverters everters on physical musculatures intact. Orthopedic evaluation and x-rays pain on palpation medial calcaneal tubercle right greater than left. Radiographic evaluation doesn't stress soft tissue increase in density at the plantar fascial calcaneal insertion site of the bilateral heels. It appears that the right heel has developed a plantar fasciitis is in the left plantar fasciitis.        Assessment & Plan:  Assessment: Plantar fasciitis is right plantar fasciitis left.  Plan: Discussed the etiology pathology conservative versus surgical therapies. Discussed appropriate shoe gear stretching exercises ice therapy and shoe modifications. Injected the bilateral heel today. Plantar fascial strappings bilateral. Night splint right. Medrol Dosepak to be followed by meloxicam. We will follow-up with him in 1 month.

## 2014-01-12 NOTE — Assessment & Plan Note (Signed)
Hypothyroidism  Pt has no clinical changes No change in energy level/ hair or skin/ edema and no tremor Lab Results  Component Value Date   TSH 0.39 12/24/2013

## 2014-01-12 NOTE — Assessment & Plan Note (Signed)
Reviewed health habits including diet and exercise and skin cancer prevention Reviewed appropriate screening tests for age  Also reviewed health mt list, fam hx and immunization status , as well as social and family history   Labs reviewed  

## 2014-01-12 NOTE — Assessment & Plan Note (Signed)
Saw urol this year for DRE psa has leveled out  Lab Results  Component Value Date   PSA 2.16 12/24/2013   PSA 3.07 11/25/2012   PSA 2.19 12/01/2011    Will continue to follow His BPH symptoms continue to be very mild

## 2014-01-12 NOTE — Progress Notes (Signed)
Subjective:    Patient ID: Carlos Larson, male    DOB: 1955-05-20, 58 y.o.   MRN: [id number]  HPI Here for health maintenance exam and to review chronic medical problems    Has been feeling ok overall  Has appt with foot doctor today - ? Plantar fasciitis --- pain in bottom of foot when he gets up   Wt is down 1 lb with bmi of 27  Flu shot 10/15 Td 7/11  colonosc 1/13  Due 5 y  fam hx - father with colon cancer   bp is stable today  No cp or palpitations or headaches or edema  No side effects to medicines  BP Readings from Last 3 Encounters:  01/12/14 124/82  05/20/13 120/78  12/02/12 108/74      Hypothyroidism  Pt has no clinical changes No change in energy level/ hair or skin/ edema and no tremor Lab Results  Component Value Date   TSH 0.39 12/24/2013      Glucose 104- is watching sugar in diet - until thanksgiving    Lipids Lab Results  Component Value Date   CHOL 226* 12/24/2013   CHOL 158 11/25/2012   CHOL 207* 12/01/2011   Lab Results  Component Value Date   HDL 36.00* 12/24/2013   HDL 28.30* 11/25/2012   HDL 30.40* 12/01/2011   Lab Results  Component Value Date   LDLCALC 172* 12/24/2013   LDLCALC 134* 11/24/2009   Lab Results  Component Value Date   TRIG 90.0 12/24/2013   TRIG 204.0* 11/25/2012   TRIG 84.0 12/01/2011   Lab Results  Component Value Date   CHOLHDL 6 12/24/2013   CHOLHDL 6 11/25/2012   CHOLHDL 7 12/01/2011   Lab Results  Component Value Date   LDLDIRECT 80.4 11/25/2012   LDLDIRECT 156.2 12/01/2011   LDLDIRECT 146.3 12/26/2010   cholesterol is up quite a bit from last time  He was not on med  LDL up  HDL up a bit also    Prostate cancer screen Down a point from last year  He saw urology last year - due to inc in his year  (had exam) BPH - nocturia is once per night - and urinates a bit more often  Lab Results  Component Value Date   PSA 2.16 12/24/2013   PSA 3.07 11/25/2012   PSA 2.19 12/01/2011      Patient Active Problem List   Diagnosis Date Noted  . Hyperlipidemia 01/12/2014  . Hematuria 12/02/2012  . Pain of right thumb 12/02/2012  . Routine general medical examination at a health care facility 12/26/2010  . Prostate cancer screening 12/26/2010  . Family history of colon cancer 12/26/2010  . Hypothyroidism 06/22/2009  . Essential hypertension 06/22/2009   Past Medical History  Diagnosis Date  . HTN (hypertension)   . Hypothyroid   . Pollen allergies     mild   Past Surgical History  Procedure Laterality Date  . Tonsillectomy  1971    and adnoids  . Colonoscopy  01/12/2006, 02/10/2011    2007 - Normal (Dr. Michail Sermon) 2013 - mild diverticulosis Carlean Purl)   History  Substance Use Topics  . Smoking status: Former Smoker    Quit date: 02/07/1972  . Smokeless tobacco: Never Used     Comment: Smoked for 4 years  . Alcohol Use: No   Family History  Problem Relation Age of Onset  . Colon cancer Father 36  . Hypertension Father   . Heart disease  Mother     valvular disease  . Hypertension Mother   . Prostate cancer Maternal Grandfather   . Hypertension Paternal Grandfather   . Coronary artery disease Maternal Grandmother   . Diabetes Neg Hx    No Known Allergies Current Outpatient Prescriptions on File Prior to Visit  Medication Sig Dispense Refill  . cholecalciferol (VITAMIN D) 1000 UNITS tablet Take 1,000 Units by mouth daily.      . meloxicam (MOBIC) 15 MG tablet Take 1 tablet (15 mg total) by mouth daily. 30 tablet 3  . methylPREDNIsolone (MEDROL DOSPACK) 4 MG tablet 6 day tapering dose-follow package instructions 21 tablet 0   No current facility-administered medications on file prior to visit.    Review of Systems    Review of Systems  Constitutional: Negative for fever, appetite change, fatigue and unexpected weight change.  Eyes: Negative for pain and visual disturbance.  Respiratory: Negative for cough and shortness of breath.   Cardiovascular:  Negative for cp or palpitations    Gastrointestinal: Negative for nausea, diarrhea and constipation.  Genitourinary: Negative for urgency and frequency.  Skin: Negative for pallor or rash   MSK pos for foot pain  Neurological: Negative for weakness, light-headedness, numbness and headaches.  Hematological: Negative for adenopathy. Does not bruise/bleed easily.  Psychiatric/Behavioral: Negative for dysphoric mood. The patient is not nervous/anxious.      Objective:   Physical Exam  Constitutional: He appears well-developed and well-nourished. No distress.  HENT:  Head: Normocephalic and atraumatic.  Right Ear: External ear normal.  Left Ear: External ear normal.  Nose: Nose normal.  Mouth/Throat: Oropharynx is clear and moist.  Eyes: Conjunctivae and EOM are normal. Pupils are equal, round, and reactive to light. Right eye exhibits no discharge. Left eye exhibits no discharge. No scleral icterus.  Neck: Normal range of motion. Neck supple. No JVD present. Carotid bruit is not present. No thyromegaly present.  Cardiovascular: Normal rate, regular rhythm, normal heart sounds and intact distal pulses.  Exam reveals no gallop.   Pulmonary/Chest: Effort normal and breath sounds normal. No respiratory distress. He has no wheezes. He exhibits no tenderness.  Abdominal: Soft. Bowel sounds are normal. He exhibits no distension, no abdominal bruit and no mass. There is no tenderness.  Musculoskeletal: He exhibits no edema or tenderness.  No swelling or deformity of feet   Lymphadenopathy:    He has no cervical adenopathy.  Neurological: He is alert. He has normal reflexes. No cranial nerve deficit. He exhibits normal muscle tone. Coordination normal.  Skin: Skin is warm and dry. No rash noted. No erythema. No pallor.  Psychiatric: He has a normal mood and affect.          Assessment & Plan:   Problem List Items Addressed This Visit      Cardiovascular and Mediastinum   Essential  hypertension - Primary    bp in fair control at this time  BP Readings from Last 1 Encounters:  01/12/14 124/82   No changes needed Disc lifstyle change with low sodium diet and exercise  Lab rev Med refilled    Relevant Medications      benazepril-hydrochlorthiazide (LOTENSIN HCT) 10-12.5 MG per tablet     Endocrine   Hypothyroidism    Hypothyroidism  Pt has no clinical changes No change in energy level/ hair or skin/ edema and no tremor Lab Results  Component Value Date   TSH 0.39 12/24/2013        Relevant Medications  levothyroxine (SYNTHROID, LEVOTHROID) tablet     Other   Family history of colon cancer    utd colonosc  Due 2018    Hyperlipidemia    Cholesterol is way up  LDL in 170s Pt thinks this is from eating fried foods Disc goals for lipids and reasons to control them Rev labs with pt Rev low sat fat diet in detail  Handout on diet given     Relevant Medications      benazepril-hydrochlorthiazide (LOTENSIN HCT) 10-12.5 MG per tablet   Prostate cancer screening    Saw urol this year for DRE psa has leveled out  Lab Results  Component Value Date   PSA 2.16 12/24/2013   PSA 3.07 11/25/2012   PSA 2.19 12/01/2011    Will continue to follow His BPH symptoms continue to be very mild    Routine general medical examination at a health care facility    Reviewed health habits including diet and exercise and skin cancer prevention Reviewed appropriate screening tests for age  Also reviewed health mt list, fam hx and immunization status , as well as social and family history    Labs reviewed

## 2014-01-12 NOTE — Patient Instructions (Signed)

## 2014-01-12 NOTE — Assessment & Plan Note (Signed)
utd colonosc  Due 2018

## 2014-01-12 NOTE — Patient Instructions (Signed)
Take care of yourself  Your cholesterol is up quite a bit - please try to change your diet / fried foods may be the cause  Avoid red meat/ fried foods/ egg yolks/ fatty breakfast meats/ butter, cheese and high fat dairy/ and shellfish     Fat and Cholesterol Control Diet Fat and cholesterol levels in your blood and organs are influenced by your diet. High levels of fat and cholesterol may lead to diseases of the heart, small and large blood vessels, gallbladder, liver, and pancreas. CONTROLLING FAT AND CHOLESTEROL WITH DIET Although exercise and lifestyle factors are important, your diet is key. That is because certain foods are known to raise cholesterol and others to lower it. The goal is to balance foods for their effect on cholesterol and more importantly, to replace saturated and trans fat with other types of fat, such as monounsaturated fat, polyunsaturated fat, and omega-3 fatty acids. On average, a person should consume no more than 15 to 17 g of saturated fat daily. Saturated and trans fats are considered "bad" fats, and they will raise LDL cholesterol. Saturated fats are primarily found in animal products such as meats, butter, and cream. However, that does not mean you need to give up all your favorite foods. Today, there are good tasting, low-fat, low-cholesterol substitutes for most of the things you like to eat. Choose low-fat or nonfat alternatives. Choose round or loin cuts of red meat. These types of cuts are lowest in fat and cholesterol. Chicken (without the skin), fish, veal, and ground [place] breast are great choices. Eliminate fatty meats, such as hot dogs and salami. Even shellfish have little or no saturated fat. Have a 3 oz (85 g) portion when you eat lean meat, poultry, or fish. Trans fats are also called "partially hydrogenated oils." They are oils that have been scientifically manipulated so that they are solid at room temperature resulting in a longer shelf life and improved  taste and texture of foods in which they are added. Trans fats are found in stick margarine, some tub margarines, cookies, crackers, and baked goods.  When baking and cooking, oils are a great substitute for butter. The monounsaturated oils are especially beneficial since it is believed they lower LDL and raise HDL. The oils you should avoid entirely are saturated tropical oils, such as coconut and palm.  Remember to eat a lot from food groups that are naturally free of saturated and trans fat, including fish, fruit, vegetables, beans, grains (barley, rice, couscous, bulgur wheat), and pasta (without cream sauces).  IDENTIFYING FOODS THAT LOWER FAT AND CHOLESTEROL  Soluble fiber may lower your cholesterol. This type of fiber is found in fruits such as apples, vegetables such as broccoli, potatoes, and carrots, legumes such as beans, peas, and lentils, and grains such as barley. Foods fortified with plant sterols (phytosterol) may also lower cholesterol. You should eat at least 2 g per day of these foods for a cholesterol lowering effect.  Read package labels to identify low-saturated fats, trans fat free, and low-fat foods at the supermarket. Select cheeses that have only 2 to 3 g saturated fat per ounce. Use a heart-healthy tub margarine that is free of trans fats or partially hydrogenated oil. When buying baked goods (cookies, crackers), avoid partially hydrogenated oils. Breads and muffins should be made from whole grains (whole-wheat or whole oat flour, instead of "flour" or "enriched flour"). Buy non-creamy canned soups with reduced salt and no added fats.  FOOD PREPARATION TECHNIQUES  Never  deep-fry. If you must fry, either stir-fry, which uses very little fat, or use non-stick cooking sprays. When possible, broil, bake, or roast meats, and steam vegetables. Instead of putting butter or margarine on vegetables, use lemon and herbs, applesauce, and cinnamon (for squash and sweet potatoes). Use nonfat  yogurt, salsa, and low-fat dressings for salads.  LOW-SATURATED FAT / LOW-FAT FOOD SUBSTITUTES Meats / Saturated Fat (g)  Avoid: Steak, marbled (3 oz/85 g) / 11 g  Choose: Steak, lean (3 oz/85 g) / 4 g  Avoid: Hamburger (3 oz/85 g) / 7 g  Choose: Hamburger, lean (3 oz/85 g) / 5 g  Avoid: Ham (3 oz/85 g) / 6 g  Choose: Ham, lean cut (3 oz/85 g) / 2.4 g  Avoid: Chicken, with skin, dark meat (3 oz/85 g) / 4 g  Choose: Chicken, skin removed, dark meat (3 oz/85 g) / 2 g  Avoid: Chicken, with skin, light meat (3 oz/85 g) / 2.5 g  Choose: Chicken, skin removed, light meat (3 oz/85 g) / 1 g Dairy / Saturated Fat (g)  Avoid: Whole milk (1 cup) / 5 g  Choose: Low-fat milk, 2% (1 cup) / 3 g  Choose: Low-fat milk, 1% (1 cup) / 1.5 g  Choose: Skim milk (1 cup) / 0.3 g  Avoid: Hard cheese (1 oz/28 g) / 6 g  Choose: Skim milk cheese (1 oz/28 g) / 2 to 3 g  Avoid: Cottage cheese, 4% fat (1 cup) / 6.5 g  Choose: Low-fat cottage cheese, 1% fat (1 cup) / 1.5 g  Avoid: Ice cream (1 cup) / 9 g  Choose: Sherbet (1 cup) / 2.5 g  Choose: Nonfat frozen yogurt (1 cup) / 0.3 g  Choose: Frozen fruit bar / trace  Avoid: Whipped cream (1 tbs) / 3.5 g  Choose: Nondairy whipped topping (1 tbs) / 1 g Condiments / Saturated Fat (g)  Avoid: Mayonnaise (1 tbs) / 2 g  Choose: Low-fat mayonnaise (1 tbs) / 1 g  Avoid: Butter (1 tbs) / 7 g  Choose: Extra light margarine (1 tbs) / 1 g  Avoid: Coconut oil (1 tbs) / 11.8 g  Choose: Olive oil (1 tbs) / 1.8 g  Choose: Corn oil (1 tbs) / 1.7 g  Choose: Safflower oil (1 tbs) / 1.2 g  Choose: Sunflower oil (1 tbs) / 1.4 g  Choose: Soybean oil (1 tbs) / 2.4 g  Choose: Canola oil (1 tbs) / 1 g Document Released: 01/23/2005 Document Revised: 05/20/2012 Document Reviewed: 04/23/2013 Exit[employer] Patient Information 2015 [place], [place]. This information is not intended to replace advice given to you by your health care provider. Make sure you  discuss any questions you have with your health care provider.

## 2014-01-13 ENCOUNTER — Telehealth: Payer: Self-pay | Admitting: Family Medicine

## 2014-01-13 NOTE — Telephone Encounter (Signed)
emmi mailed  °

## 2014-02-09 ENCOUNTER — Ambulatory Visit: Payer: 59 | Admitting: Podiatry

## 2014-02-23 ENCOUNTER — Ambulatory Visit (INDEPENDENT_AMBULATORY_CARE_PROVIDER_SITE_OTHER): Payer: Commercial Managed Care - PPO | Admitting: Podiatry

## 2014-02-23 ENCOUNTER — Encounter: Payer: Self-pay | Admitting: Podiatry

## 2014-02-23 DIAGNOSIS — M722 Plantar fascial fibromatosis: Secondary | ICD-10-CM

## 2014-02-23 NOTE — Progress Notes (Signed)
He presents today for follow-up of plantar fasciitis bilateral. He states that he is approximately 80% better but his left heel hurts more than the right.  Objective: Vital signs are stable he is alert and oriented 3 pulses are palpable bilateral. He has pain on palpation medial calcaneal tubercle of the left heel none on the right.  Assessment: Plantar fasciitis left greater than right right is resolving.  Plan: Injected the left heel today with Kenalog and local anesthetic and continue all conservative therapies until he is 100% improved +1 month.

## 2014-03-23 ENCOUNTER — Ambulatory Visit: Payer: Self-pay | Admitting: Podiatry

## 2014-05-12 ENCOUNTER — Encounter: Payer: Self-pay | Admitting: Primary Care

## 2014-05-12 ENCOUNTER — Ambulatory Visit (INDEPENDENT_AMBULATORY_CARE_PROVIDER_SITE_OTHER): Payer: 59 | Admitting: Primary Care

## 2014-05-12 ENCOUNTER — Ambulatory Visit (INDEPENDENT_AMBULATORY_CARE_PROVIDER_SITE_OTHER)
Admission: RE | Admit: 2014-05-12 | Discharge: 2014-05-12 | Disposition: A | Discharge status: Home / Self Care | Payer: 59 | Source: Ambulatory Visit | Attending: Primary Care | Admitting: Primary Care

## 2014-05-12 VITALS — BP 134/84 | HR 71 | Temp 98.1°F | Ht 67.0 in | Wt 180.8 lb

## 2014-05-12 DIAGNOSIS — R059 Cough, unspecified: Secondary | ICD-10-CM

## 2014-05-12 DIAGNOSIS — R05 Cough: Secondary | ICD-10-CM

## 2014-05-12 MED ORDER — DOXYCYCLINE HYCLATE 100 MG PO TABS: 100.0000 mg | ORAL_TABLET | Freq: Two times a day (BID) | ORAL | Status: DC

## 2014-05-12 NOTE — Progress Notes (Signed)
Subjective:    Patient ID: Carlos Larson, male    DOB: November 01, 1955, 60 y.o.   MRN: [id number]  HPI  Mr. Mccollam is a 59 year old male who presents today with a chief complaint of productive cough (greenish sputum) with chest congestion and shortness of breath that has been present for 2 weeks. His symptoms began with sinus congestion with improvement after taking Flonase, and then noticed the cough with chest congestion. He's been taking Mucinex, a cough suppressant, and severe cold and flu OTC. The cough suppressant has worked to relief cough and he's been taking benadryl at night for sleep. He denies nausea, but reports he may have had a fever last week. He's a non-smoker and he has had no improvement in his cough symptoms in the past 2 weeks.    Review of Systems  Constitutional: Positive for chills. Negative for fever.  HENT: Negative for ear pain, rhinorrhea, sinus pressure and sore throat.   Respiratory: Positive for shortness of breath. Negative for wheezing.   Cardiovascular: Negative for chest pain.  Gastrointestinal: Negative for nausea and vomiting.  Musculoskeletal: Negative for myalgias.  Neurological: Negative for dizziness and headaches.  Hematological: Negative for adenopathy.       Past Medical History  Diagnosis Date  . HTN (hypertension)   . Hypothyroid   . Pollen allergies     mild    History   Social History  . Marital Status: Single    Spouse Name: N/A  . Number of Children: N/A  . Years of Education: N/A   Occupational History  . Lineman [employer]   Social History Main Topics  . Smoking status: Former Smoker    Quit date: 02/07/1972  . Smokeless tobacco: Never Used     Comment: Smoked for 4 years  . Alcohol Use: No  . Drug Use: No  . Sexual Activity: Not on file   Other Topics Concern  . Not on file   Social History Narrative   Married      Regular exercise (lifts weights)      Lineman for [employer]    Past Surgical History    Procedure Laterality Date  . Tonsillectomy  1971    and adnoids  . Colonoscopy  01/12/2006, 02/10/2011    2007 - Normal (Dr. Michail Sermon) 2013 - mild diverticulosis Carlean Purl)    Family History  Problem Relation Age of Onset  . Colon cancer Father 72  . Hypertension Father   . Heart disease Mother     valvular disease  . Hypertension Mother   . Prostate cancer Maternal Grandfather   . Hypertension Paternal Grandfather   . Coronary artery disease Maternal Grandmother   . Diabetes Neg Hx     No Known Allergies  Current Outpatient Prescriptions on File Prior to Visit  Medication Sig Dispense Refill  . benazepril-hydrochlorthiazide (LOTENSIN HCT) 10-12.5 MG per tablet Take 1 tablet by mouth daily. 90 tablet 3  . cholecalciferol (VITAMIN D) 1000 UNITS tablet Take 1,000 Units by mouth daily.      Marland Kitchen levothyroxine (SYNTHROID, LEVOTHROID) 100 MCG tablet TAKE 1 TABLET BY MOUTH DAILY BEFORE BREAKFAST. 90 tablet 3   No current facility-administered medications on file prior to visit.    BP 134/84 mmHg  Pulse 71  Temp(Src) 98.1 F (36.7 C) (Oral)  Ht 5\' 7"  (1.702 m)  Wt 180 lb 12.8 oz (82.01 kg)  BMI 28.31 kg/m2  SpO2 97%    Objective:   Physical Exam  Constitutional: He is oriented to person, place, and time.  Appears sickly  HENT:  Right Ear: External ear normal.  Left Ear: External ear normal.  Nose: Nose normal.  Mouth/Throat: Oropharynx is clear and moist. No oropharyngeal exudate.  Eyes: Conjunctivae are normal. Pupils are equal, round, and reactive to light.  Neck: Neck supple.  Cardiovascular: Normal rate and regular rhythm.   Pulmonary/Chest: Effort normal.  Crackles to RLL  Lymphadenopathy:    He has cervical adenopathy.  Neurological: He is alert and oriented to person, place, and time.  Skin: Skin is warm and dry.  Psychiatric: He has a normal mood and affect.          Assessment & Plan:  Cough: Suspect this may be community acquired pneumonia. Will  obtain xray to rule out and will also treat empirically with Doxycycline given duration of symptoms and exam. Continue cough suppressant, drink plenty of fluids. Mask provided to patient to use around his family members. Patient to follow up in 3-4 days if no improvement.

## 2014-05-12 NOTE — Patient Instructions (Signed)
Obtain xray prior to leaving. I will notify you of your results. Start Doxycycline tablets for infection. One tablet by mouth twice daily. Continue your cough suppressants as discussed. Call me if no improvement in the next 3-4 days. Take care!

## 2014-05-12 NOTE — Progress Notes (Signed)
Pre visit review using our clinic review tool, if applicable. No additional management support is needed unless otherwise documented below in the visit note. 

## 2014-05-15 ENCOUNTER — Telehealth: Payer: Self-pay

## 2014-05-15 DIAGNOSIS — R05 Cough: Secondary | ICD-10-CM

## 2014-05-15 DIAGNOSIS — R059 Cough, unspecified: Secondary | ICD-10-CM

## 2014-05-15 MED ORDER — AMOXICILLIN-POT CLAVULANATE 875-125 MG PO TABS: 1.0000 | ORAL_TABLET | Freq: Two times a day (BID) | ORAL | Status: DC

## 2014-05-15 NOTE — Telephone Encounter (Signed)
Please notify Mr. Carlos Larson that I've ordered Augmentin, a different antibiotic, to his pharmacy. He will take one tablet by mouth twice daily for 7 days. He is to stop taking the Doxycycline. Tell him he needs to be resting at home for at least a day or two and that I'm happy to write him a work note. Have him call me if no improvement in the next 3-4 days.  Thank you.

## 2014-05-15 NOTE — Telephone Encounter (Signed)
Called and notified patient of Kate's comments. Patient verbalized understanding. Will call if worsen or not better.

## 2014-05-15 NOTE — Telephone Encounter (Signed)
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 Pt request cb and wants to know if needs different abx or what to do. [facility]. Pt request cb ASAP.

## 2015-01-03 ENCOUNTER — Telehealth: Payer: Self-pay | Admitting: Family Medicine

## 2015-01-03 DIAGNOSIS — Z0000 Encounter for general adult medical examination without abnormal findings: Secondary | ICD-10-CM

## 2015-01-03 DIAGNOSIS — Z125 Encounter for screening for malignant neoplasm of prostate: Secondary | ICD-10-CM

## 2015-01-03 NOTE — Telephone Encounter (Signed)
-----   Message from Ellamae Sia sent at 12/24/2014  2:43 PM EST ----- Regarding: Lab orders for Tuesday, 11.29.16 Patient is scheduled for CPX labs, please order future labs, Thanks , Karna Christmas

## 2015-01-05 ENCOUNTER — Other Ambulatory Visit (INDEPENDENT_AMBULATORY_CARE_PROVIDER_SITE_OTHER): Payer: 59

## 2015-01-05 DIAGNOSIS — Z125 Encounter for screening for malignant neoplasm of prostate: Secondary | ICD-10-CM

## 2015-01-05 DIAGNOSIS — Z0000 Encounter for general adult medical examination without abnormal findings: Secondary | ICD-10-CM

## 2015-01-05 LAB — CBC WITH DIFFERENTIAL/PLATELET
BASOS ABS: 0 10*3/uL (ref 0.0–0.1)
Basophils Relative: 0.3 % (ref 0.0–3.0)
Eosinophils Absolute: 0.2 10*3/uL (ref 0.0–0.7)
Eosinophils Relative: 2 % (ref 0.0–5.0)
HCT: 47.9 % (ref 39.0–52.0)
HEMOGLOBIN: 16.4 g/dL (ref 13.0–17.0)
LYMPHS PCT: 19 % (ref 12.0–46.0)
Lymphs Abs: 1.5 10*3/uL (ref 0.7–4.0)
MCHC: 34.2 g/dL (ref 30.0–36.0)
MCV: 92.3 fl (ref 78.0–100.0)
MONOS PCT: 4.7 % (ref 3.0–12.0)
Monocytes Absolute: 0.4 10*3/uL (ref 0.1–1.0)
Neutro Abs: 5.9 10*3/uL (ref 1.4–7.7)
Neutrophils Relative %: 74 % (ref 43.0–77.0)
Platelets: 198 10*3/uL (ref 150.0–400.0)
RBC: 5.19 Mil/uL (ref 4.22–5.81)
RDW: 13.2 % (ref 11.5–15.5)
WBC: 8 10*3/uL (ref 4.0–10.5)

## 2015-01-05 LAB — COMPREHENSIVE METABOLIC PANEL
ALK PHOS: 68 U/L (ref 39–117)
ALT: 33 U/L (ref 0–53)
AST: 27 U/L (ref 0–37)
Albumin: 4.4 g/dL (ref 3.5–5.2)
BILIRUBIN TOTAL: 0.8 mg/dL (ref 0.2–1.2)
BUN: 16 mg/dL (ref 6–23)
CO2: 31 mEq/L (ref 19–32)
Calcium: 10.1 mg/dL (ref 8.4–10.5)
Chloride: 102 mEq/L (ref 96–112)
Creatinine, Ser: 1.09 mg/dL (ref 0.40–1.50)
GFR: 73.36 mL/min (ref >60.00)
GLUCOSE: 103 mg/dL — AB (ref 70–99)
Potassium: 4.4 mEq/L (ref 3.5–5.1)
SODIUM: 140 meq/L (ref 135–145)
TOTAL PROTEIN: 6.7 g/dL (ref 6.0–8.3)

## 2015-01-05 LAB — LIPID PANEL
Cholesterol: 196 mg/dL (ref 0–200)
HDL: 32.7 mg/dL — ABNORMAL LOW (ref >39.00)
LDL Cholesterol: 138 mg/dL — ABNORMAL HIGH (ref 0–99)
NONHDL: 163.43
Total CHOL/HDL Ratio: 6
Triglycerides: 125 mg/dL (ref 0.0–149.0)
VLDL: 25 mg/dL (ref 0.0–40.0)

## 2015-01-05 LAB — PSA: PSA: 2.68 ng/mL (ref 0.10–4.00)

## 2015-01-05 LAB — TSH: TSH: 0.82 u[IU]/mL (ref 0.35–4.50)

## 2015-01-08 ENCOUNTER — Ambulatory Visit (INDEPENDENT_AMBULATORY_CARE_PROVIDER_SITE_OTHER): Payer: 59 | Admitting: Family Medicine

## 2015-01-08 ENCOUNTER — Other Ambulatory Visit: Payer: 59

## 2015-01-08 ENCOUNTER — Encounter: Payer: Self-pay | Admitting: Family Medicine

## 2015-01-08 VITALS — BP 118/70 | HR 68 | Temp 98.3°F | Ht 67.0 in | Wt 179.2 lb

## 2015-01-08 DIAGNOSIS — I10 Essential (primary) hypertension: Secondary | ICD-10-CM | POA: Diagnosis not present

## 2015-01-08 DIAGNOSIS — E039 Hypothyroidism, unspecified: Secondary | ICD-10-CM | POA: Diagnosis not present

## 2015-01-08 DIAGNOSIS — E785 Hyperlipidemia, unspecified: Secondary | ICD-10-CM

## 2015-01-08 DIAGNOSIS — Z125 Encounter for screening for malignant neoplasm of prostate: Secondary | ICD-10-CM

## 2015-01-08 DIAGNOSIS — Z0000 Encounter for general adult medical examination without abnormal findings: Secondary | ICD-10-CM | POA: Diagnosis not present

## 2015-01-08 MED ORDER — LEVOTHYROXINE SODIUM 100 MCG PO TABS: ORAL_TABLET | ORAL | Status: DC

## 2015-01-08 MED ORDER — BENAZEPRIL-HYDROCHLOROTHIAZIDE 10-12.5 MG PO TABS: 1.0000 | ORAL_TABLET | Freq: Every day | ORAL | Status: DC

## 2015-01-08 NOTE — Progress Notes (Signed)
Pre visit review using our clinic review tool, if applicable. No additional management support is needed unless otherwise documented below in the visit note. 

## 2015-01-08 NOTE — Progress Notes (Signed)
Subjective:    Patient ID: Carlos Larson, male    DOB: 09-18-1955, 59 y.o.   MRN: [id number]  HPI Here for health maintenance exam and to review chronic medical problems    Doing ok overall   His hands go to sleep at night - esp her R hand  Wakes him up and he has to get up and shake it (not bad during the day)  ? Start of carpal tunnel  Works with a Ecologist a lot  Has not worn a wrist splint    Wt is down 1 lb  bmi of 28 Has cut back on fried foods  Built a barn this summer - lost more wt (170) and then gained some back    Hep C/HIV screening  Not high risk- he declines   Flu shot 10 /16  colonosc 1/13 nl , 5 year recall for fam hx (father) No new colon cancer in family  No personal bowel changes   Td 7/11  Prostate ca screen Lab Results  Component Value Date   PSA 2.68 01/05/2015   PSA 2.16 12/24/2013   PSA 3.07 11/25/2012   released from his urologist  BPH - gets up once per night-no more than that  GF maternal had prostate cancer    bp is stable today  No cp or palpitations or headaches or edema  No side effects to medicines  BP Readings from Last 3 Encounters:  01/08/15 118/70  05/12/14 134/84  01/12/14 111/67     Hypothyroidism  Pt has no clinical changes No change in energy level/ hair or skin/ edema and no tremor Lab Results  Component Value Date   TSH 0.82 01/05/2015     Hyperlipidemia Lab Results  Component Value Date   CHOL 196 01/05/2015   CHOL 226* 12/24/2013   CHOL 158 11/25/2012   Lab Results  Component Value Date   HDL 32.70* 01/05/2015   HDL 36.00* 12/24/2013   HDL 28.30* 11/25/2012   Lab Results  Component Value Date   LDLCALC 138* 01/05/2015   LDLCALC 172* 12/24/2013   LDLCALC 134* 11/24/2009   Lab Results  Component Value Date   TRIG 125.0 01/05/2015   TRIG 90.0 12/24/2013   TRIG 204.0* 11/25/2012   Lab Results  Component Value Date   CHOLHDL 6 01/05/2015   CHOLHDL 6 12/24/2013   CHOLHDL 6 11/25/2012    Lab Results  Component Value Date   LDLDIRECT 80.4 11/25/2012   LDLDIRECT 156.2 12/01/2011   LDLDIRECT 146.3 12/26/2010   LDL is down from 172 to 138- and interested in red yeast rice  Reducing fatty foods  Has low HDL  Is considering inc his fish oil from 1000 to 2000   Glucose is 103   Patient Active Problem List   Diagnosis Date Noted  . Hyperlipidemia 01/12/2014  . Hematuria 12/02/2012  . Pain of right thumb 12/02/2012  . Routine general medical examination at a health care facility 12/26/2010  . Prostate cancer screening 12/26/2010  . Family history of colon cancer 12/26/2010  . Hypothyroidism 06/22/2009  . Essential hypertension 06/22/2009   Past Medical History  Diagnosis Date  . HTN (hypertension)   . Hypothyroid   . Pollen allergies     mild   Past Surgical History  Procedure Laterality Date  . Tonsillectomy  1971    and adnoids  . Colonoscopy  01/12/2006, 02/10/2011    2007 - Normal (Dr. Michail Sermon) 2013 - mild diverticulosis Carlean Purl)  Social History  Substance Use Topics  . Smoking status: Former Smoker    Quit date: 02/07/1972  . Smokeless tobacco: Never Used     Comment: Smoked for 4 years  . Alcohol Use: No   Family History  Problem Relation Age of Onset  . Colon cancer Father 31  . Hypertension Father   . Heart disease Mother     valvular disease  . Hypertension Mother   . Prostate cancer Maternal Grandfather   . Hypertension Paternal Grandfather   . Coronary artery disease Maternal Grandmother   . Diabetes Neg Hx    No Known Allergies Current Outpatient Prescriptions on File Prior to Visit  Medication Sig Dispense Refill  . cholecalciferol (VITAMIN D) 1000 UNITS tablet Take 1,000 Units by mouth daily.      . Omega-3 Fatty Acids (FISH OIL) 1000 MG CAPS Take 1 capsule by mouth.     No current facility-administered medications on file prior to visit.    Review of Systems Review of Systems  Constitutional: Negative for fever, appetite  change, fatigue and unexpected weight change.  Eyes: Negative for pain and visual disturbance.  Respiratory: Negative for cough and shortness of breath.   Cardiovascular: Negative for cp or palpitations    Gastrointestinal: Negative for nausea, diarrhea and constipation.  Genitourinary: Negative for urgency and frequency.  Skin: Negative for pallor or rash   Neurological: Negative for weakness, light-headedness,  and headaches. pos for occ hand numbness in the night - worse on R Hematological: Negative for adenopathy. Does not bruise/bleed easily.  Psychiatric/Behavioral: Negative for dysphoric mood. The patient is not nervous/anxious.         Objective:   Physical Exam  Constitutional: He appears well-developed and well-nourished. No distress.  Well appearing   HENT:  Head: Normocephalic and atraumatic.  Right Ear: External ear normal.  Left Ear: External ear normal.  Nose: Nose normal.  Mouth/Throat: Oropharynx is clear and moist.  Eyes: Conjunctivae and EOM are normal. Pupils are equal, round, and reactive to light. Right eye exhibits no discharge. Left eye exhibits no discharge. No scleral icterus.  Neck: Normal range of motion. Neck supple. No JVD present. Carotid bruit is not present. No thyromegaly present.  Cardiovascular: Normal rate, regular rhythm, normal heart sounds and intact distal pulses.  Exam reveals no gallop.   Pulmonary/Chest: Effort normal and breath sounds normal. No respiratory distress. He has no wheezes. He exhibits no tenderness.  Abdominal: Soft. Bowel sounds are normal. He exhibits no distension, no abdominal bruit and no mass. There is no tenderness.  Musculoskeletal: He exhibits no edema or tenderness.  Lymphadenopathy:    He has no cervical adenopathy.  Neurological: He is alert. He has normal reflexes. No cranial nerve deficit. He exhibits normal muscle tone. Coordination normal.  Neg tinel and phalen tests on hands   Skin: Skin is warm and dry. No  rash noted. No erythema. No pallor.  Psychiatric: He has a normal mood and affect.          Assessment & Plan:   Problem List Items Addressed This Visit      Cardiovascular and Mediastinum   Essential hypertension - Primary    bp in fair control at this time  BP Readings from Last 1 Encounters:  01/08/15 118/70   No changes needed Disc lifstyle change with low sodium diet and exercise  Labs reviewed       Relevant Medications   benazepril-hydrochlorthiazide (LOTENSIN HCT) 10-12.5 MG tablet  Endocrine   Hypothyroidism    Hypothyroidism  Pt has no clinical changes No change in energy level/ hair or skin/ edema and no tremor Lab Results  Component Value Date   TSH 0.82 01/05/2015          Relevant Medications   levothyroxine (SYNTHROID, LEVOTHROID) 100 MCG tablet     Other   Hyperlipidemia    Disc goals for lipids and reasons to control them Rev labs with pt Rev low sat fat diet in detail LDL is down- commended  Can inc fish oil/exercise for low HDL Continue to follow      Relevant Medications   benazepril-hydrochlorthiazide (LOTENSIN HCT) 10-12.5 MG tablet   Prostate cancer screening    Pt was released from urology No c/o Lab Results  Component Value Date   PSA 2.68 01/05/2015   PSA 2.16 12/24/2013   PSA 3.07 11/25/2012         Routine general medical examination at a health care facility    Reviewed health habits including diet and exercise and skin cancer prevention Reviewed appropriate screening tests for age  Also reviewed health mt list, fam hx and immunization status , as well as social and family history   See HPI Labs reviewed  Enc to stay active

## 2015-01-08 NOTE — Patient Instructions (Signed)
Keep eating a low fat diet for cholesterol (Avoid red meat/ fried foods/ egg yolks/ fatty breakfast meats/ butter, cheese and high fat dairy/ and shellfish) Eat fish (with fins) and try to increase fish oil for your HDL (good cholesterol)  Stay active   Get a wrist splint over the counter for carpal tunnel  Try wearing on R hand at night - lets see if it helps

## 2015-01-10 NOTE — Assessment & Plan Note (Signed)
bp in fair control at this time  BP Readings from Last 1 Encounters:  01/08/15 118/70   No changes needed Disc lifstyle change with low sodium diet and exercise  Labs reviewed

## 2015-01-10 NOTE — Assessment & Plan Note (Signed)
Pt was released from urology No c/o Lab Results  Component Value Date   PSA 2.68 01/05/2015   PSA 2.16 12/24/2013   PSA 3.07 11/25/2012

## 2015-01-10 NOTE — Assessment & Plan Note (Signed)
Disc goals for lipids and reasons to control them Rev labs with pt Rev low sat fat diet in detail LDL is down- commended  Can inc fish oil/exercise for low HDL Continue to follow

## 2015-01-10 NOTE — Assessment & Plan Note (Signed)
Reviewed health habits including diet and exercise and skin cancer prevention Reviewed appropriate screening tests for age  Also reviewed health mt list, fam hx and immunization status , as well as social and family history   See HPI Labs reviewed  Enc to stay active

## 2015-01-10 NOTE — Assessment & Plan Note (Signed)
Hypothyroidism  Pt has no clinical changes No change in energy level/ hair or skin/ edema and no tremor Lab Results  Component Value Date   TSH 0.82 01/05/2015

## 2015-01-15 ENCOUNTER — Encounter: Payer: 59 | Admitting: Family Medicine

## 2015-03-11 ENCOUNTER — Other Ambulatory Visit: Payer: Self-pay | Admitting: Family Medicine

## 2015-04-19 ENCOUNTER — Ambulatory Visit (INDEPENDENT_AMBULATORY_CARE_PROVIDER_SITE_OTHER): Payer: 59 | Admitting: Family Medicine

## 2015-04-19 ENCOUNTER — Encounter: Payer: Self-pay | Admitting: Family Medicine

## 2015-04-19 VITALS — BP 132/78 | HR 78 | Temp 98.2°F | Ht 67.0 in | Wt 185.5 lb

## 2015-04-19 DIAGNOSIS — B9789 Other viral agents as the cause of diseases classified elsewhere: Principal | ICD-10-CM

## 2015-04-19 DIAGNOSIS — J069 Acute upper respiratory infection, unspecified: Secondary | ICD-10-CM | POA: Insufficient documentation

## 2015-04-19 MED ORDER — AZITHROMYCIN 250 MG PO TABS: ORAL_TABLET | ORAL | Status: DC

## 2015-04-19 MED ORDER — BENZONATATE 200 MG PO CAPS: 200.0000 mg | ORAL_CAPSULE | Freq: Three times a day (TID) | ORAL | Status: DC | PRN

## 2015-04-19 NOTE — Progress Notes (Signed)
Pre visit review using our clinic review tool, if applicable. No additional management support is needed unless otherwise documented below in the visit note. 

## 2015-04-19 NOTE — Assessment & Plan Note (Signed)
Reassuring exam  Disc symptomatic care - see instructions on AVS  Tessalon sent to pharmacy Recommend mucinex DM and fluids and rest   If no imp in 2-3 d will fill zpak and update  Update if not starting to improve in a week or if worsening

## 2015-04-19 NOTE — Patient Instructions (Signed)
I think you have a viral head and chest cold  Drink lots of fluids  Try the tessalon for cough -three times per day  Over the counter - my favorite thing for cough and congestion is mucinex DM  Watch for fever/facial pain/ shortness of breath  And if not starting to improve in 2-3 days - fill px for zithromax and take it as directed    Update if not starting to improve in a week or if worsening

## 2015-04-19 NOTE — Progress Notes (Signed)
Subjective:    Patient ID: Carlos Larson, male    DOB: 02-13-1955, 60 y.o.   MRN: [id number]  HPI Here with uri symptoms   Wed - dry cough and then prod  Then chest congestion and head congestion   No fever  No chills or aches or sweats  A little more sob on exertion   Mucous from nose -yellow to green Chest - can hear it / cannot bring much up   Taking alka selzer plus med for flu  Then just tylenol severe cold and flu   No facial pain  A little ST only when he coughs   Tired    Patient Active Problem List   Diagnosis Date Noted  . Hyperlipidemia 01/12/2014  . Hematuria 12/02/2012  . Pain of right thumb 12/02/2012  . Routine general medical examination at a health care facility 12/26/2010  . Prostate cancer screening 12/26/2010  . Family history of colon cancer 12/26/2010  . Hypothyroidism 06/22/2009  . Essential hypertension 06/22/2009   Past Medical History  Diagnosis Date  . HTN (hypertension)   . Hypothyroid   . Pollen allergies     mild   Past Surgical History  Procedure Laterality Date  . Tonsillectomy  1971    and adnoids  . Colonoscopy  01/12/2006, 02/10/2011    2007 - Normal (Dr. Michail Sermon) 2013 - mild diverticulosis Carlean Purl)   Social History  Substance Use Topics  . Smoking status: Former Smoker    Quit date: 02/07/1972  . Smokeless tobacco: Never Used     Comment: Smoked for 4 years  . Alcohol Use: No   Family History  Problem Relation Age of Onset  . Colon cancer Father 73  . Hypertension Father   . Heart disease Mother     valvular disease  . Hypertension Mother   . Prostate cancer Maternal Grandfather   . Hypertension Paternal Grandfather   . Coronary artery disease Maternal Grandmother   . Diabetes Neg Hx    No Known Allergies Current Outpatient Prescriptions on File Prior to Visit  Medication Sig Dispense Refill  . benazepril-hydrochlorthiazide (LOTENSIN HCT) 10-12.5 MG tablet Take 1 tablet by mouth daily. 90 tablet 3  .  cholecalciferol (VITAMIN D) 1000 UNITS tablet Take 1,000 Units by mouth daily.      Marland Kitchen levothyroxine (SYNTHROID, LEVOTHROID) 100 MCG tablet TAKE 1 TABLET BY MOUTH DAILY BEFORE BREAKFAST. 90 tablet 3  . Omega-3 Fatty Acids (FISH OIL) 1000 MG CAPS Take 1 capsule by mouth.     No current facility-administered medications on file prior to visit.    Review of Systems  Constitutional: Positive for appetite change and fatigue. Negative for fever.  HENT: Positive for congestion, postnasal drip, rhinorrhea, sinus pressure, sneezing and sore throat. Negative for ear pain.   Eyes: Negative for pain and discharge.  Respiratory: Positive for cough. Negative for shortness of breath, wheezing and stridor.   Cardiovascular: Negative for chest pain.  Gastrointestinal: Negative for nausea, vomiting and diarrhea.  Genitourinary: Negative for urgency, frequency and hematuria.  Musculoskeletal: Negative for myalgias and arthralgias.  Skin: Negative for rash.  Neurological: Positive for headaches. Negative for dizziness, weakness and light-headedness.  Psychiatric/Behavioral: Negative for confusion and dysphoric mood.       Objective:   Physical Exam  Constitutional: He appears well-developed and well-nourished. No distress.  Well appearing but fatigued (not sob)  HENT:  Head: Normocephalic and atraumatic.  Right Ear: External ear normal.  Left Ear: External ear normal.  Mouth/Throat: Oropharynx is clear and moist.  Nares are injected and congested  No sinus tenderness Clear rhinorrhea and post nasal drip   Eyes: Conjunctivae and EOM are normal. Pupils are equal, round, and reactive to light. Right eye exhibits no discharge. Left eye exhibits no discharge.  Neck: Normal range of motion. Neck supple.  Cardiovascular: Normal rate and normal heart sounds.   Pulmonary/Chest: Effort normal and breath sounds normal. No respiratory distress. He has no wheezes. He has no rales. He exhibits no tenderness.  No  wheeze even on forced exp  Lymphadenopathy:    He has no cervical adenopathy.  Neurological: He is alert.  Skin: Skin is warm and dry. No rash noted.  Ruddy complexion  Psychiatric: He has a normal mood and affect.          Assessment & Plan:   Problem List Items Addressed This Visit      Respiratory   Viral URI with cough - Primary    Reassuring exam  Disc symptomatic care - see instructions on AVS  Tessalon sent to pharmacy Recommend mucinex DM and fluids and rest   If no imp in 2-3 d will fill zpak and update  Update if not starting to improve in a week or if worsening        Relevant Medications   azithromycin (ZITHROMAX Z-PAK) 250 MG tablet

## 2015-11-25 ENCOUNTER — Other Ambulatory Visit: Payer: Self-pay | Admitting: *Deleted

## 2015-11-25 MED ORDER — BENAZEPRIL-HYDROCHLOROTHIAZIDE 10-12.5 MG PO TABS: 1.0000 | ORAL_TABLET | Freq: Every day | ORAL | 1 refills | Status: DC

## 2015-11-25 MED ORDER — LEVOTHYROXINE SODIUM 100 MCG PO TABS: ORAL_TABLET | ORAL | 1 refills | Status: DC

## 2015-11-25 NOTE — Telephone Encounter (Signed)
Spoke with wife and insurance has changed and needs Rxs sent to [facility], done and wife aware

## 2016-01-09 ENCOUNTER — Telehealth: Payer: Self-pay | Admitting: Family Medicine

## 2016-01-09 DIAGNOSIS — Z125 Encounter for screening for malignant neoplasm of prostate: Secondary | ICD-10-CM

## 2016-01-09 DIAGNOSIS — Z0000 Encounter for general adult medical examination without abnormal findings: Secondary | ICD-10-CM

## 2016-01-09 NOTE — Telephone Encounter (Signed)
-----   Message from Ellamae Sia sent at 01/06/2016  9:33 AM EST ----- Regarding: Lab orders for Thursday, 12.7.17 Patient is scheduled for CPX labs, please order future labs, Thanks , Karna Christmas

## 2016-01-13 ENCOUNTER — Other Ambulatory Visit (INDEPENDENT_AMBULATORY_CARE_PROVIDER_SITE_OTHER): Payer: BLUE CROSS/BLUE SHIELD

## 2016-01-13 DIAGNOSIS — Z125 Encounter for screening for malignant neoplasm of prostate: Secondary | ICD-10-CM

## 2016-01-13 DIAGNOSIS — Z0000 Encounter for general adult medical examination without abnormal findings: Secondary | ICD-10-CM | POA: Diagnosis not present

## 2016-01-13 DIAGNOSIS — R7989 Other specified abnormal findings of blood chemistry: Secondary | ICD-10-CM

## 2016-01-13 LAB — COMPREHENSIVE METABOLIC PANEL
ALBUMIN: 4.5 g/dL (ref 3.5–5.2)
ALK PHOS: 74 U/L (ref 39–117)
ALT: 24 U/L (ref 0–53)
AST: 21 U/L (ref 0–37)
BUN: 11 mg/dL (ref 6–23)
CALCIUM: 10.3 mg/dL (ref 8.4–10.5)
CO2: 33 mEq/L — ABNORMAL HIGH (ref 19–32)
Chloride: 103 mEq/L (ref 96–112)
Creatinine, Ser: 1.05 mg/dL (ref 0.40–1.50)
GFR: 76.33 mL/min (ref >60.00)
Glucose, Bld: 102 mg/dL — ABNORMAL HIGH (ref 70–99)
POTASSIUM: 4.5 meq/L (ref 3.5–5.1)
Sodium: 143 mEq/L (ref 135–145)
TOTAL PROTEIN: 6.9 g/dL (ref 6.0–8.3)
Total Bilirubin: 0.9 mg/dL (ref 0.2–1.2)

## 2016-01-13 LAB — LIPID PANEL
CHOLESTEROL: 181 mg/dL (ref 0–200)
HDL: 30.5 mg/dL — AB (ref >39.00)
NonHDL: 150.94
Total CHOL/HDL Ratio: 6
Triglycerides: 213 mg/dL — ABNORMAL HIGH (ref 0.0–149.0)
VLDL: 42.6 mg/dL — AB (ref 0.0–40.0)

## 2016-01-13 LAB — CBC WITH DIFFERENTIAL/PLATELET
Basophils Absolute: 0 10*3/uL (ref 0.0–0.1)
Basophils Relative: 0.3 % (ref 0.0–3.0)
Eosinophils Absolute: 0.2 10*3/uL (ref 0.0–0.7)
Eosinophils Relative: 2.7 % (ref 0.0–5.0)
HCT: 49.2 % (ref 39.0–52.0)
HEMOGLOBIN: 17 g/dL (ref 13.0–17.0)
LYMPHS PCT: 24.2 % (ref 12.0–46.0)
Lymphs Abs: 2 10*3/uL (ref 0.7–4.0)
MCHC: 34.6 g/dL (ref 30.0–36.0)
MCV: 92.8 fl (ref 78.0–100.0)
Monocytes Absolute: 0.5 10*3/uL (ref 0.1–1.0)
Monocytes Relative: 5.6 % (ref 3.0–12.0)
Neutro Abs: 5.5 10*3/uL (ref 1.4–7.7)
Neutrophils Relative %: 67.2 % (ref 43.0–77.0)
Platelets: 217 10*3/uL (ref 150.0–400.0)
RBC: 5.3 Mil/uL (ref 4.22–5.81)
RDW: 13.5 % (ref 11.5–15.5)
WBC: 8.2 10*3/uL (ref 4.0–10.5)

## 2016-01-13 LAB — PSA: PSA: 3.05 ng/mL (ref 0.10–4.00)

## 2016-01-13 LAB — TSH: TSH: 3.24 u[IU]/mL (ref 0.35–4.50)

## 2016-01-13 LAB — LDL CHOLESTEROL, DIRECT: Direct LDL: 103 mg/dL

## 2016-01-17 ENCOUNTER — Encounter: Payer: Self-pay | Admitting: Family Medicine

## 2016-01-17 ENCOUNTER — Ambulatory Visit (INDEPENDENT_AMBULATORY_CARE_PROVIDER_SITE_OTHER): Payer: BLUE CROSS/BLUE SHIELD | Admitting: Family Medicine

## 2016-01-17 VITALS — BP 126/68 | HR 72 | Temp 98.6°F | Ht 67.0 in | Wt 184.8 lb

## 2016-01-17 DIAGNOSIS — Z0000 Encounter for general adult medical examination without abnormal findings: Secondary | ICD-10-CM | POA: Diagnosis not present

## 2016-01-17 DIAGNOSIS — E039 Hypothyroidism, unspecified: Secondary | ICD-10-CM

## 2016-01-17 DIAGNOSIS — E7800 Pure hypercholesterolemia, unspecified: Secondary | ICD-10-CM

## 2016-01-17 DIAGNOSIS — R972 Elevated prostate specific antigen [PSA]: Secondary | ICD-10-CM

## 2016-01-17 DIAGNOSIS — I10 Essential (primary) hypertension: Secondary | ICD-10-CM

## 2016-01-17 DIAGNOSIS — R7309 Other abnormal glucose: Secondary | ICD-10-CM

## 2016-01-17 DIAGNOSIS — Z125 Encounter for screening for malignant neoplasm of prostate: Secondary | ICD-10-CM | POA: Diagnosis not present

## 2016-01-17 DIAGNOSIS — Z800 Family history of malignant neoplasm of digestive organs: Secondary | ICD-10-CM

## 2016-01-17 MED ORDER — LEVOTHYROXINE SODIUM 100 MCG PO TABS: ORAL_TABLET | ORAL | 3 refills | Status: DC

## 2016-01-17 MED ORDER — BENAZEPRIL-HYDROCHLOROTHIAZIDE 10-12.5 MG PO TABS: 1.0000 | ORAL_TABLET | Freq: Every day | ORAL | 3 refills | Status: DC

## 2016-01-17 NOTE — Assessment & Plan Note (Signed)
5 year recall colonscopy due 2018 Ref done

## 2016-01-17 NOTE — Progress Notes (Signed)
Subjective:    Patient ID: Carlos Larson, male    DOB: [birth year], 60 y.o.   MRN: [id number]  HPI Here for health maintenance exam and to review chronic medical problems    Doing fairly well   Retired from work- doing well  More time for self care  Time with grand children - helping out with pick ups/ etc    Wt Readings from Last 3 Encounters:  01/17/16 184 lb 12 oz (83.8 kg)  04/19/15 185 lb 8 oz (84.1 kg)  01/08/15 179 lb 4 oz (81.3 kg)  trying to exercise more - some weight lifting with a machine and likes it (has a machine at home - 3 times per week) Eating pretty healthy overall / avoiding fried foods  Eats broiled foods  bmi 28.9  Zoster vaccine - will check on coverage with his new insurance   Flu shot 9/17  Colonoscopy 1/13-no polyps 5 year recall due to family hx (father with colon cancer)   Tetanus shot 7/11  Prostate  Lab Results  Component Value Date   PSA 3.05 01/13/2016   PSA 2.68 01/05/2015   PSA 2.16 12/24/2013   prostate cancer in [facility]  Some nocturia 1 time per nigh  psa is just shy of 1/2 point  No trouble emptying bladder  Is urinating more frequently during the day if her drinks a lot    bp is stable today  No cp or palpitations or headaches or edema  No side effects to medicines  BP Readings from Last 3 Encounters:  01/17/16 126/68  04/19/15 132/78  01/08/15 118/70     Hypothyroidism  Pt has no clinical changes No change in energy level/ hair or skin/ edema and no tremor Lab Results  Component Value Date   TSH 3.24 01/13/2016      Hx of hyperlipidemia  Lab Results  Component Value Date   CHOL 181 01/13/2016   CHOL 196 01/05/2015   CHOL 226 (H) 12/24/2013   Lab Results  Component Value Date   HDL 30.50 (L) 01/13/2016   HDL 32.70 (L) 01/05/2015   HDL 36.00 (L) 12/24/2013   Lab Results  Component Value Date   LDLCALC 138 (H) 01/05/2015   LDLCALC 172 (H) 12/24/2013   LDLCALC 134 (H) 11/24/2009   Lab Results    Component Value Date   TRIG 213.0 (H) 01/13/2016   TRIG 125.0 01/05/2015   TRIG 90.0 12/24/2013   Lab Results  Component Value Date   CHOLHDL 6 01/13/2016   CHOLHDL 6 01/05/2015   CHOLHDL 6 12/24/2013   Lab Results  Component Value Date   LDLDIRECT 103.0 01/13/2016   LDLDIRECT 80.4 11/25/2012   LDLDIRECT 156.2 12/01/2011    Decrease in the LDL -avoiding fried foods and broiling  Also drinks apple cider vinegar 1 Tbsp per day  Still takes fish oil  Low HDL   Results for orders placed or performed in visit on 01/13/16  CBC with Differential/Platelet  Result Value Ref Range   WBC 8.2 4.0 - 10.5 K/uL   RBC 5.30 4.22 - 5.81 Mil/uL   Hemoglobin 17.0 13.0 - 17.0 g/dL   HCT 49.2 39.0 - 52.0 %   MCV 92.8 78.0 - 100.0 fl   MCHC 34.6 30.0 - 36.0 g/dL   RDW 13.5 11.5 - 15.5 %   Platelets 217.0 150.0 - 400.0 K/uL   Neutrophils Relative % 67.2 43.0 - 77.0 %   Lymphocytes Relative 24.2 12.0 - 46.0 %  Monocytes Relative 5.6 3.0 - 12.0 %   Eosinophils Relative 2.7 0.0 - 5.0 %   Basophils Relative 0.3 0.0 - 3.0 %   Neutro Abs 5.5 1.4 - 7.7 K/uL   Lymphs Abs 2.0 0.7 - 4.0 K/uL   Monocytes Absolute 0.5 0.1 - 1.0 K/uL   Eosinophils Absolute 0.2 0.0 - 0.7 K/uL   Basophils Absolute 0.0 0.0 - 0.1 K/uL  Comprehensive metabolic panel  Result Value Ref Range   Sodium 143 135 - 145 mEq/L   Potassium 4.5 3.5 - 5.1 mEq/L   Chloride 103 96 - 112 mEq/L   CO2 33 (H) 19 - 32 mEq/L   Glucose, Bld 102 (H) 70 - 99 mg/dL   BUN 11 6 - 23 mg/dL   Creatinine, Ser 1.05 0.40 - 1.50 mg/dL   Total Bilirubin 0.9 0.2 - 1.2 mg/dL   Alkaline Phosphatase 74 39 - 117 U/L   AST 21 0 - 37 U/L   ALT 24 0 - 53 U/L   Total Protein 6.9 6.0 - 8.3 g/dL   Albumin 4.5 3.5 - 5.2 g/dL   Calcium 10.3 8.4 - 10.5 mg/dL   GFR 76.33 >60.00 mL/min  Lipid panel  Result Value Ref Range   Cholesterol 181 0 - 200 mg/dL   Triglycerides 213.0 (H) 0.0 - 149.0 mg/dL   HDL 30.50 (L) >39.00 mg/dL   VLDL 42.6 (H) 0.0 - 40.0  mg/dL   Total CHOL/HDL Ratio 6    NonHDL 150.94   TSH  Result Value Ref Range   TSH 3.24 0.35 - 4.50 uIU/mL  PSA  Result Value Ref Range   PSA 3.05 0.10 - 4.00 ng/mL  LDL cholesterol, direct  Result Value Ref Range   Direct LDL 103.0 mg/dL    Glucose fasting is 102  Not a big regular sweet eater  More carbohydrate   Patient Active Problem List   Diagnosis Date Noted  . Elevated random blood glucose level 01/17/2016  . PSA elevation 01/17/2016  . Hyperlipidemia 01/12/2014  . Hematuria 12/02/2012  . Pain of right thumb 12/02/2012  . Routine general medical examination at a health care facility 12/26/2010  . Prostate cancer screening 12/26/2010  . Family history of colon cancer 12/26/2010  . Hypothyroidism 06/22/2009  . Essential hypertension 06/22/2009   Past Medical History:  Diagnosis Date  . HTN (hypertension)   . Hypothyroid   . Pollen allergies    mild   Past Surgical History:  Procedure Laterality Date  . COLONOSCOPY  01/12/2006, 02/10/2011   2007 - Normal (Dr. Michail Sermon) 2013 - mild diverticulosis Carlean Purl)  . TONSILLECTOMY  1971   and adnoids   Social History  Substance Use Topics  . Smoking status: Former Smoker    Quit date: 02/07/1972  . Smokeless tobacco: Never Used     Comment: Smoked for 4 years  . Alcohol use No   Family History  Problem Relation Age of Onset  . Colon cancer Father 68  . Hypertension Father   . Heart disease Mother     valvular disease  . Hypertension Mother   . Prostate cancer Maternal Grandfather   . Hypertension Paternal Grandfather   . Coronary artery disease Maternal Grandmother   . Diabetes Neg Hx    No Known Allergies Current Outpatient Prescriptions on File Prior to Visit  Medication Sig Dispense Refill  . cholecalciferol (VITAMIN D) 1000 UNITS tablet Take 1,000 Units by mouth daily.      . Omega-3 Fatty Acids (  FISH OIL) 1000 MG CAPS Take 1 capsule by mouth.     No current facility-administered medications on file  prior to visit.     Review of Systems Review of Systems  Constitutional: Negative for fever, appetite change, fatigue and unexpected weight change.  Eyes: Negative for pain and visual disturbance.  Respiratory: Negative for cough and shortness of breath.   Cardiovascular: Negative for cp or palpitations    Gastrointestinal: Negative for nausea, diarrhea and constipation.  Genitourinary: Negative for urgency and frequency. pos for nocturia  Skin: Negative for pallor or rash   Neurological: Negative for weakness, light-headedness, numbness and headaches.  Hematological: Negative for adenopathy. Does not bruise/bleed easily.  Psychiatric/Behavioral: Negative for dysphoric mood. The patient is not nervous/anxious.         Objective:   Physical Exam  Constitutional: He is oriented to person, place, and time. He appears well-developed and well-nourished. No distress.  Well appearing   HENT:  Head: Normocephalic and atraumatic.  Right Ear: External ear normal.  Left Ear: External ear normal.  Nose: Nose normal.  Mouth/Throat: Oropharynx is clear and moist.  Eyes: Conjunctivae and EOM are normal. Pupils are equal, round, and reactive to light. Right eye exhibits no discharge. Left eye exhibits no discharge. No scleral icterus.  Neck: Normal range of motion. Neck supple. No JVD present. Carotid bruit is not present. No thyromegaly present.  Cardiovascular: Normal rate, regular rhythm, normal heart sounds and intact distal pulses.  Exam reveals no gallop.   Pulmonary/Chest: Effort normal and breath sounds normal. No respiratory distress. He has no wheezes. He has no rales.  Abdominal: Soft. Bowel sounds are normal. He exhibits no distension, no abdominal bruit and no mass. There is no tenderness.  Genitourinary: Rectum normal and prostate normal.  Genitourinary Comments: Prostate is upper limit of nl size, symmetric, firm and nt  Nl rectal tone  Musculoskeletal: Normal range of motion. He  exhibits no edema or tenderness.  Lymphadenopathy:    He has no cervical adenopathy.  Neurological: He is alert and oriented to person, place, and time. He has normal reflexes. No cranial nerve deficit. He exhibits normal muscle tone. Coordination normal.  Skin: Skin is warm and dry. No rash noted. No erythema. No pallor.  Psychiatric: He has a normal mood and affect.          Assessment & Plan:   Problem List Items Addressed This Visit      Cardiovascular and Mediastinum   Essential hypertension - Primary    bp in fair control at this time  BP Readings from Last 1 Encounters:  01/17/16 126/68   No changes needed Disc lifstyle change with low sodium diet and exercise Labs rev  Med renewed for a year       Relevant Medications   benazepril-hydrochlorthiazide (LOTENSIN HCT) 10-12.5 MG tablet     Endocrine   Hypothyroidism    Hypothyroidism  Pt has no clinical changes No change in energy level/ hair or skin/ edema and no tremor Lab Results  Component Value Date   TSH 3.24 01/13/2016    Renewed levothyroxine       Relevant Medications   levothyroxine (SYNTHROID, LEVOTHROID) 100 MCG tablet     Other   Routine general medical examination at a health care facility    Reviewed health habits including diet and exercise and skin cancer prevention Reviewed appropriate screening tests for age  Also reviewed health mt list, fam hx and immunization status , as well  as social and family history   Info given re: shingle vaccine to consider if affordable  He will check ins  Labs reviewed psa is up slt- suspect mild BPH-watch closely and re check 6 mo  Disc imp of exercise and low glycemic diet to prevent DM Ref for 5 y colonoscopy      PSA elevation    Lab Results  Component Value Date   PSA 3.05 01/13/2016   PSA 2.68 01/05/2015   PSA 2.16 12/24/2013   Suspect due to mild BPH  Re assuring exam Re check 6 mo       Prostate cancer screening    psa is up a bit    Large-normal size on DRE Mild nocturia Suspect mild BPH-will watch  Re check psa 6 mo instead of a year -will follow Lab Results  Component Value Date   PSA 3.05 01/13/2016   PSA 2.68 01/05/2015   PSA 2.16 12/24/2013         Hyperlipidemia    Disc goals for lipids and reasons to control them Rev labs with pt Rev low sat fat diet in detail Commended on lower LDL with better diet Enc exercise for HDL which seems to be genetically low       Relevant Medications   benazepril-hydrochlorthiazide (LOTENSIN HCT) 10-12.5 MG tablet   Family history of colon cancer    5 year recall colonscopy due 2018 Ref done      Relevant Orders   Ambulatory referral to Gastroenterology   Elevated random blood glucose level    102 fasting  Enc lower glycemic diet to prevent DM  Will stop fruit juices

## 2016-01-17 NOTE — Assessment & Plan Note (Signed)
Disc goals for lipids and reasons to control them Rev labs with pt Rev low sat fat diet in detail Commended on lower LDL with better diet Enc exercise for HDL which seems to be genetically low

## 2016-01-17 NOTE — Assessment & Plan Note (Addendum)
Lab Results  Component Value Date   PSA 3.05 01/13/2016   PSA 2.68 01/05/2015   PSA 2.16 12/24/2013   Suspect due to mild BPH  Re assuring exam Re check 6 mo

## 2016-01-17 NOTE — Assessment & Plan Note (Signed)
bp in fair control at this time  BP Readings from Last 1 Encounters:  01/17/16 126/68   No changes needed Disc lifstyle change with low sodium diet and exercise Labs rev  Med renewed for a year

## 2016-01-17 NOTE — Progress Notes (Signed)
Pre visit review using our clinic review tool, if applicable. No additional management support is needed unless otherwise documented below in the visit note. 

## 2016-01-17 NOTE — Assessment & Plan Note (Signed)
psa is up a bit  Large-normal size on DRE Mild nocturia Suspect mild BPH-will watch  Re check psa 6 mo instead of a year -will follow Lab Results  Component Value Date   PSA 3.05 01/13/2016   PSA 2.68 01/05/2015   PSA 2.16 12/24/2013

## 2016-01-17 NOTE — Patient Instructions (Addendum)
If you are interested in a shingles/zoster vaccine - call your insurance to check on coverage,( you should not get it within 1 month of other vaccines) , then call us for a prescription  for it to take to a pharmacy that gives the shot , or make a nurse visit to get it here depending on your coverage  Stop at checkout for colonoscopy referral   Cholesterol is improved  Blood glucose is in the high normal range  Control sweets  Get rid of fruit juice  Control portions of starches - bread/potato/rice/pasta/corn  Always pick brown instead of white   Let's re check your PSA in 6 mo

## 2016-01-17 NOTE — Assessment & Plan Note (Signed)
Hypothyroidism  Pt has no clinical changes No change in energy level/ hair or skin/ edema and no tremor Lab Results  Component Value Date   TSH 3.24 01/13/2016    Renewed levothyroxine

## 2016-01-17 NOTE — Assessment & Plan Note (Signed)
Reviewed health habits including diet and exercise and skin cancer prevention Reviewed appropriate screening tests for age  Also reviewed health mt list, fam hx and immunization status , as well as social and family history   Info given re: shingle vaccine to consider if affordable  He will check ins  Labs reviewed psa is up slt- suspect mild BPH-watch closely and re check 6 mo  Disc imp of exercise and low glycemic diet to prevent DM Ref for 5 y colonoscopy

## 2016-01-17 NOTE — Assessment & Plan Note (Signed)
102 fasting  Enc lower glycemic diet to prevent DM  Will stop fruit juices

## 2016-02-18 ENCOUNTER — Encounter: Payer: Self-pay | Admitting: Internal Medicine

## 2016-02-21 ENCOUNTER — Encounter: Payer: Self-pay | Admitting: Internal Medicine

## 2016-04-14 ENCOUNTER — Ambulatory Visit (AMBULATORY_SURGERY_CENTER): Payer: Self-pay

## 2016-04-14 VITALS — Ht 67.0 in | Wt 184.8 lb

## 2016-04-14 DIAGNOSIS — Z800 Family history of malignant neoplasm of digestive organs: Secondary | ICD-10-CM

## 2016-04-14 NOTE — Progress Notes (Signed)
No allergies to eggs or soy No past problems with anesthesia No diet meds No home oxygen  Declined emmi 

## 2016-04-21 ENCOUNTER — Encounter: Payer: Self-pay | Admitting: Internal Medicine

## 2016-04-28 ENCOUNTER — Encounter: Payer: Self-pay | Admitting: Internal Medicine

## 2016-04-28 ENCOUNTER — Ambulatory Visit (AMBULATORY_SURGERY_CENTER): Payer: BLUE CROSS/BLUE SHIELD | Admitting: Internal Medicine

## 2016-04-28 VITALS — BP 109/78 | HR 64 | Temp 96.8°F | Resp 26 | Ht 67.0 in | Wt 184.0 lb

## 2016-04-28 DIAGNOSIS — Z1212 Encounter for screening for malignant neoplasm of rectum: Secondary | ICD-10-CM

## 2016-04-28 DIAGNOSIS — Z800 Family history of malignant neoplasm of digestive organs: Secondary | ICD-10-CM | POA: Diagnosis not present

## 2016-04-28 DIAGNOSIS — Z1211 Encounter for screening for malignant neoplasm of colon: Secondary | ICD-10-CM

## 2016-04-28 MED ORDER — SODIUM CHLORIDE 0.9 % IV SOLN: 500.0000 mL | INTRAVENOUS | Status: DC

## 2016-04-28 NOTE — Progress Notes (Signed)
0926  A/ox3 pleased with MAC, report to [facility]

## 2016-04-28 NOTE — Progress Notes (Signed)
A/ox3 pleased with MAC, report to [facility]

## 2016-04-28 NOTE — Patient Instructions (Addendum)
   No polyps today (again). Guidelines indicate you should repeat this again in about 5 years.  I appreciate the opportunity to care for you. Gatha Mayer, MD, FACG   YOU HAD AN ENDOSCOPIC PROCEDURE TODAY: Refer to the procedure report and other information in the discharge instructions given to you for any specific questions about what was found during the examination. If this information does not answer your questions, please call [place] office at [phone] to clarify.   YOU SHOULD EXPECT: Some feelings of bloating in the abdomen. Passage of more gas than usual. Walking can help get rid of the air that was put into your GI tract during the procedure and reduce the bloating. If you had a lower endoscopy (such as a colonoscopy or flexible sigmoidoscopy) you may notice spotting of blood in your stool or on the toilet paper. Some abdominal soreness may be present for a day or two, also.  DIET: Your first meal following the procedure should be a light meal and then it is ok to progress to your normal diet. A half-sandwich or bowl of soup is an example of a good first meal. Heavy or fried foods are harder to digest and may make you feel nauseous or bloated. Drink plenty of fluids but you should avoid alcoholic beverages for 24 hours. If you had a esophageal dilation, please see attached instructions for diet.    ACTIVITY: Your care partner should take you home directly after the procedure. You should plan to take it easy, moving slowly for the rest of the day. You can resume normal activity the day after the procedure however YOU SHOULD NOT DRIVE, use power tools, machinery or perform tasks that involve climbing or major physical exertion for 24 hours (because of the sedation medicines used during the test).   SYMPTOMS TO REPORT IMMEDIATELY: A gastroenterologist can be reached at any hour. Please call [phone]  for any of the following symptoms:  Following lower endoscopy (colonoscopy,  flexible sigmoidoscopy) Excessive amounts of blood in the stool  Significant tenderness, worsening of abdominal pains  Swelling of the abdomen that is new, acute  Fever of 100 or higher   FOLLOW UP:  If any biopsies were taken you will be contacted by phone or by letter within the next 1-3 weeks. Call [phone]  if you have not heard about the biopsies in 3 weeks.  Please also call with any specific questions about appointments or follow up tests.

## 2016-04-28 NOTE — Op Note (Signed)
[facility] Patient Name: Carlos Larson Procedure Date: 04/28/2016 8:53 AM MRN: [id number] Endoscopist: Gatha Mayer , MD Age: 61 Referring MD:  Date of Birth: May 07, 1955 Gender: Male Account #: [id number] Procedure:                Colonoscopy Indications:              Screening in patient at increased risk: Family                            history of 1st-degree relative with colorectal                            cancer Medicines:                Propofol per Anesthesia, Monitored Anesthesia Care Procedure:                Pre-Anesthesia Assessment:                           - Prior to the procedure, a History and Physical                            was performed, and patient medications and                            allergies were reviewed. The patient's tolerance of                            previous anesthesia was also reviewed. The risks                            and benefits of the procedure and the sedation                            options and risks were discussed with the patient.                            All questions were answered, and informed consent                            was obtained. Prior Anticoagulants: The patient has                            taken no previous anticoagulant or antiplatelet                            agents. ASA Grade Assessment: II - A patient with                            mild systemic disease. After reviewing the risks                            and benefits, the patient was deemed in  satisfactory condition to undergo the procedure.                           After obtaining informed consent, the colonoscope                            was passed under direct vision. Throughout the                            procedure, the patient's blood pressure, pulse, and                            oxygen saturations were monitored continuously. The                            Colonoscope was introduced through  the anus and                            advanced to the the cecum, identified by                            appendiceal orifice and ileocecal valve. The                            colonoscopy was performed without difficulty. The                            patient tolerated the procedure well. The quality                            of the bowel preparation was excellent. The bowel                            preparation used was Miralax. The ileocecal valve,                            appendiceal orifice, and rectum were photographed. Scope In: 9:05:57 AM Scope Out: 9:17:35 AM Scope Withdrawal Time: 0 hours 9 minutes 48 seconds  Total Procedure Duration: 0 hours 11 minutes 38 seconds  Findings:                 The perianal and digital rectal examinations were                            normal. Pertinent negatives include normal prostate                            (size, shape, and consistency).                           A few diverticula were found in the sigmoid colon.                           The exam was otherwise without abnormality on  direct and retroflexion views. Complications:            No immediate complications. Estimated Blood Loss:     Estimated blood loss: none. Impression:               - Diverticulosis in the sigmoid colon.                           - The examination was otherwise normal on direct                            and retroflexion views.                           - No specimens collected. Recommendation:           - Patient has a contact number available for                            emergencies. The signs and symptoms of potential                            delayed complications were discussed with the                            patient. Return to normal activities tomorrow.                            Written discharge instructions were provided to the                            patient.                           - Resume previous  diet.                           - Continue present medications.                           - Repeat colonoscopy in 5 years for screening                            purposes. Gatha Mayer, MD 04/28/2016 9:23:10 AM This report has been signed electronically.

## 2016-04-28 NOTE — Progress Notes (Signed)
Pt's states no medical or surgical changes since previsit or office visit. 

## 2016-05-01 ENCOUNTER — Telehealth: Payer: Self-pay

## 2016-05-01 NOTE — Telephone Encounter (Signed)
  Follow up Call-  Call back number 04/28/2016  Post procedure Call Back phone  # [phone]  Permission to leave phone message Yes  Some recent data might be hidden     Patient questions:  Do you have a fever, pain , or abdominal swelling? No. Pain Score  0 *  Have you tolerated food without any problems? Yes.    Have you been able to return to your normal activities? Yes.    Do you have any questions about your discharge instructions: Diet   No. Medications  No. Follow up visit  No.  Do you have questions or concerns about your Care? No.  Actions: * If pain score is 4 or above: No action needed, pain <4.

## 2016-05-23 ENCOUNTER — Telehealth: Payer: Self-pay | Admitting: Family Medicine

## 2016-05-23 NOTE — Telephone Encounter (Signed)
Pt notified of Dr. Marliss Coots comments. Pt wanted to know if it's okay to take the generic vicks dayquil and vicks nightquil

## 2016-05-23 NOTE — Telephone Encounter (Signed)
Patient advised.

## 2016-05-23 NOTE — Telephone Encounter (Signed)
[facility] Patient Name: Carlos Larson DOB: [birth year] Initial Comment Caller states he has allergies, BP medication and synthyroid medicine. He is wondering what he can take over the counter for his allergies. Runny nose and sneezing. Nurse Assessment Nurse: Markus Daft, RN, Sherre Poot Date/Time (Eastern Time): 05/23/2016 9:10:32 AM Confirm and document reason for call. If symptomatic, describe symptoms. ---Caller states he has allergies including runny nose and sneezing. He has Zyrtec D. Benazepril and Synthyroid medicine. He is wondering what he can take over the counter for his allergies. Does the patient have any new or worsening symptoms? ---Yes Will a triage be completed? ---No Select reason for no triage. ---Patient declined Please document clinical information provided and list any resource used. ---RN advised that he not take Zyrtec-D, and advised the following, "Moderate levothyroxine pseudoephedrine Applies to: Synthroid (levothyroxine), Zyrtec-D (cetirizine / pseudoephedrine) Talk to your doctor before using levothyroxine together with pseudoephedrine. Combining these medications may increase the risk of cardiovascular side effects such as high blood pressure, palpitation, chest pain, and irregular heart beat. If you have preexisting heart disease, your condition may worsen." -- Moderate risk between Benazepril and Benadryl so do not take this either. RN advised that he take Claritin or Zyrtec and also could use saline nasal washes. Caller verbalized understanding. (per [employer] Drug interaction checker). Guidelines Guideline Title Affirmed Question Affirmed Notes Final Disposition User Clinical Call Nice, RN, [employer]

## 2016-05-23 NOTE — Telephone Encounter (Signed)
That is ok- use caution of sedation

## 2016-05-23 NOTE — Telephone Encounter (Signed)
A plain antihistamine w/o "D" is good Also nasal saline

## 2016-06-01 ENCOUNTER — Ambulatory Visit (INDEPENDENT_AMBULATORY_CARE_PROVIDER_SITE_OTHER): Payer: BLUE CROSS/BLUE SHIELD | Admitting: Primary Care

## 2016-06-01 ENCOUNTER — Encounter: Payer: Self-pay | Admitting: Primary Care

## 2016-06-01 VITALS — BP 146/92 | HR 74 | Temp 98.0°F | Ht 67.0 in | Wt 183.0 lb

## 2016-06-01 DIAGNOSIS — J069 Acute upper respiratory infection, unspecified: Secondary | ICD-10-CM | POA: Diagnosis not present

## 2016-06-01 MED ORDER — AMOXICILLIN 500 MG PO CAPS: 500.0000 mg | ORAL_CAPSULE | Freq: Two times a day (BID) | ORAL | 0 refills | Status: DC

## 2016-06-01 NOTE — Progress Notes (Signed)
Subjective:    Patient ID: Carlos Larson, male    DOB: Jun 21, 1955, 61 y.o.   MRN: [id number]  HPI  Mr. Ramsay is a 61 year old male who presents today with a chief complaint of nasal congestion. He also reports cough, chest congestion, sore throat. His symptoms began 12 days ago with rhinorrhea and sore throat. His cough and chest congestion began early this week. His cough is productive with greenish/brown in color. He's taken Zyrtec and Dayquil/Nyquil with some improvement. He started feeling worse towards the beginning of this week. He denies fevers.   Review of Systems  Constitutional: Positive for fatigue. Negative for chills and fever.  HENT: Positive for congestion, rhinorrhea and sore throat.   Respiratory: Positive for cough. Negative for shortness of breath and wheezing.        Past Medical History:  Diagnosis Date  . Cataract   . HTN (hypertension)   . Hyperlipidemia   . Hypothyroid   . Pollen allergies    mild     Social History   Social History  . Marital status: Single    Spouse name: N/A  . Number of children: N/A  . Years of education: N/A   Occupational History  . Lineman [employer]   Social History Main Topics  . Smoking status: Former Smoker    Quit date: 02/07/1972  . Smokeless tobacco: Never Used     Comment: Smoked for 4 years  . Alcohol use No  . Drug use: No  . Sexual activity: Not on file   Other Topics Concern  . Not on file   Social History Narrative   Married      Regular exercise (lifts weights)      Lineman for [employer]    Past Surgical History:  Procedure Laterality Date  . COLONOSCOPY  01/12/2006, 02/10/2011   2007 - Normal (Dr. Michail Sermon) 2013 - mild diverticulosis Carlean Purl)  . TONSILLECTOMY  1971   and adnoids    Family History  Problem Relation Age of Onset  . Colon cancer Father 58  . Hypertension Father   . Heart disease Mother     valvular disease  . Hypertension Mother   . Prostate cancer Maternal  Grandfather   . Hypertension Paternal Grandfather   . Coronary artery disease Maternal Grandmother   . Diabetes Neg Hx     No Known Allergies  Current Outpatient Prescriptions on File Prior to Visit  Medication Sig Dispense Refill  . benazepril-hydrochlorthiazide (LOTENSIN HCT) 10-12.5 MG tablet Take 1 tablet by mouth daily. 90 tablet 3  . cholecalciferol (VITAMIN D) 1000 UNITS tablet Take 1,000 Units by mouth daily.      Marland Kitchen levothyroxine (SYNTHROID, LEVOTHROID) 100 MCG tablet TAKE 1 TABLET BY MOUTH DAILY BEFORE BREAKFAST. 90 tablet 3  . Omega-3 Fatty Acids (FISH OIL) 1200 MG CPDR Take 1 capsule by mouth.     Current Facility-Administered Medications on File Prior to Visit  Medication Dose Route Frequency Provider Last Rate Last Dose  . 0.9 %  sodium chloride infusion  500 mL Intravenous Continuous Gatha Mayer, MD        BP (!) 146/92   Pulse 74   Temp 98 F (36.7 C) (Oral)   Ht 5\' 7"  (1.702 m)   Wt 183 lb (83 kg)   SpO2 98%   BMI 28.66 kg/m    Objective:   Physical Exam  Constitutional: He appears well-nourished. He does not appear ill.  HENT:  Right Ear: Tympanic membrane and ear canal normal.  Left Ear: Tympanic membrane and ear canal normal.  Nose: Mucosal edema present. Right sinus exhibits no maxillary sinus tenderness and no frontal sinus tenderness. Left sinus exhibits no maxillary sinus tenderness and no frontal sinus tenderness.  Mouth/Throat: Oropharynx is clear and moist.  Eyes: Conjunctivae are normal.  Neck: Neck supple.  Cardiovascular: Normal rate and regular rhythm.   Pulmonary/Chest: Effort normal and breath sounds normal. He has no wheezes. He has no rales.  Skin: Skin is warm and dry.          Assessment & Plan:  URI:  Cough, congestion, fatigue x 12 days. Exam today with clear lungs, does appear ill. Given duration of symptoms coupled with presentation will treat. Rx for Amoxil course sent to pharmacy.  Continue Dayquil/Nyquil,  zyrtec. Fluids, rest, follow up PRN.  Sheral Flow, NP

## 2016-06-01 NOTE — Patient Instructions (Signed)
Start amoxicillin antibiotics. Take 1 tablet by mouth twice daily for 7 days.  Continue Dayquil/Nyquil as needed for cough.  Continue Zyrtec.  Ensure you are staying hydrated with water and rest.  It was a pleasure to see you today!

## 2016-06-01 NOTE — Progress Notes (Signed)
Pre visit review using our clinic review tool, if applicable. No additional management support is needed unless otherwise documented below in the visit note. 

## 2016-06-07 ENCOUNTER — Telehealth: Payer: Self-pay

## 2016-06-07 DIAGNOSIS — J069 Acute upper respiratory infection, unspecified: Secondary | ICD-10-CM

## 2016-06-07 MED ORDER — FLUTICASONE PROPIONATE 50 MCG/ACT NA SUSP: 1.0000 | Freq: Two times a day (BID) | NASAL | 0 refills | Status: DC

## 2016-06-07 MED ORDER — BENZONATATE 200 MG PO CAPS: 200.0000 mg | ORAL_CAPSULE | Freq: Three times a day (TID) | ORAL | 0 refills | Status: DC | PRN

## 2016-06-07 NOTE — Telephone Encounter (Signed)
Pt left v/m; pt seen 06/01/16; pt has taken abx and pt feels slightly better; still runny nose and cough. Pt request different abx to get rid of symptoms completely. Pt request cb. [facility]

## 2016-06-07 NOTE — Telephone Encounter (Signed)
Please notify patient that I'm glad to hear he's feeling better. The symptoms may persist up to two weeks after completing antibiotics. For runny nose I recommend Flonase, continue Zyrtec.. I'm happy to call in [place] for cough if he's interested. Let me know.

## 2016-06-07 NOTE — Telephone Encounter (Signed)
Noted, both prescription sent to pharmacy.

## 2016-06-07 NOTE — Telephone Encounter (Signed)
Spoken and notified patient of Kate's comments. Patient verbalized understanding.  Patient stated that he would like the Tessalon Pearls. Patient stated that the last time he had these, it was 200 mg, so he thinks? Is that okay?  Also could you send the Flonase as well, patient believe is will be cheaper for him this way.

## 2016-07-11 DIAGNOSIS — L720 Epidermal cyst: Secondary | ICD-10-CM | POA: Diagnosis not present

## 2016-07-11 DIAGNOSIS — D1801 Hemangioma of skin and subcutaneous tissue: Secondary | ICD-10-CM | POA: Diagnosis not present

## 2016-07-20 ENCOUNTER — Telehealth: Payer: Self-pay | Admitting: Family Medicine

## 2016-07-20 DIAGNOSIS — E7800 Pure hypercholesterolemia, unspecified: Secondary | ICD-10-CM

## 2016-07-20 DIAGNOSIS — R972 Elevated prostate specific antigen [PSA]: Secondary | ICD-10-CM

## 2016-07-20 DIAGNOSIS — R7309 Other abnormal glucose: Secondary | ICD-10-CM

## 2016-07-20 DIAGNOSIS — I10 Essential (primary) hypertension: Secondary | ICD-10-CM

## 2016-07-20 DIAGNOSIS — E039 Hypothyroidism, unspecified: Secondary | ICD-10-CM

## 2016-07-20 DIAGNOSIS — R739 Hyperglycemia, unspecified: Secondary | ICD-10-CM

## 2016-07-20 NOTE — Telephone Encounter (Signed)
-----   Message from Ellamae Sia sent at 07/20/2016  3:29 PM EDT ----- Regarding: Lab orders for Friday, 6.15.18 Lab orders for a 6 month follow up appt

## 2016-07-21 ENCOUNTER — Other Ambulatory Visit (INDEPENDENT_AMBULATORY_CARE_PROVIDER_SITE_OTHER): Payer: BLUE CROSS/BLUE SHIELD

## 2016-07-21 DIAGNOSIS — R972 Elevated prostate specific antigen [PSA]: Secondary | ICD-10-CM | POA: Diagnosis not present

## 2016-07-21 DIAGNOSIS — R7309 Other abnormal glucose: Secondary | ICD-10-CM | POA: Diagnosis not present

## 2016-07-21 LAB — HEMOGLOBIN A1C: Hgb A1c MFr Bld: 5.2 % (ref 4.6–6.5)

## 2016-07-21 LAB — PSA: PSA: 3.03 ng/mL (ref 0.10–4.00)

## 2016-07-24 ENCOUNTER — Encounter: Payer: Self-pay | Admitting: *Deleted

## 2016-08-11 ENCOUNTER — Other Ambulatory Visit: Payer: Self-pay | Admitting: Primary Care

## 2016-08-11 DIAGNOSIS — J069 Acute upper respiratory infection, unspecified: Secondary | ICD-10-CM

## 2016-09-04 DIAGNOSIS — H2513 Age-related nuclear cataract, bilateral: Secondary | ICD-10-CM | POA: Diagnosis not present

## 2016-11-07 ENCOUNTER — Other Ambulatory Visit: Payer: Self-pay

## 2016-11-07 MED ORDER — BENAZEPRIL-HYDROCHLOROTHIAZIDE 10-12.5 MG PO TABS: 1.0000 | ORAL_TABLET | Freq: Every day | ORAL | 0 refills | Status: DC

## 2016-11-07 NOTE — Telephone Encounter (Signed)
Pt ins has changed and request benazepril-HCTZ to [place]. Pt has already [facility] not to send the benazepril _HCTZ. CPX scheduled 01/17/17. Pt notified done.

## 2016-12-21 IMAGING — CR DG CHEST 2V
2 series · 2 of 2 positions shown · non-contrast
Comparison: None.

CLINICAL DATA: Two week history of cough

EXAM:
CHEST  2 VIEW

[view not recorded (1 of 2)]
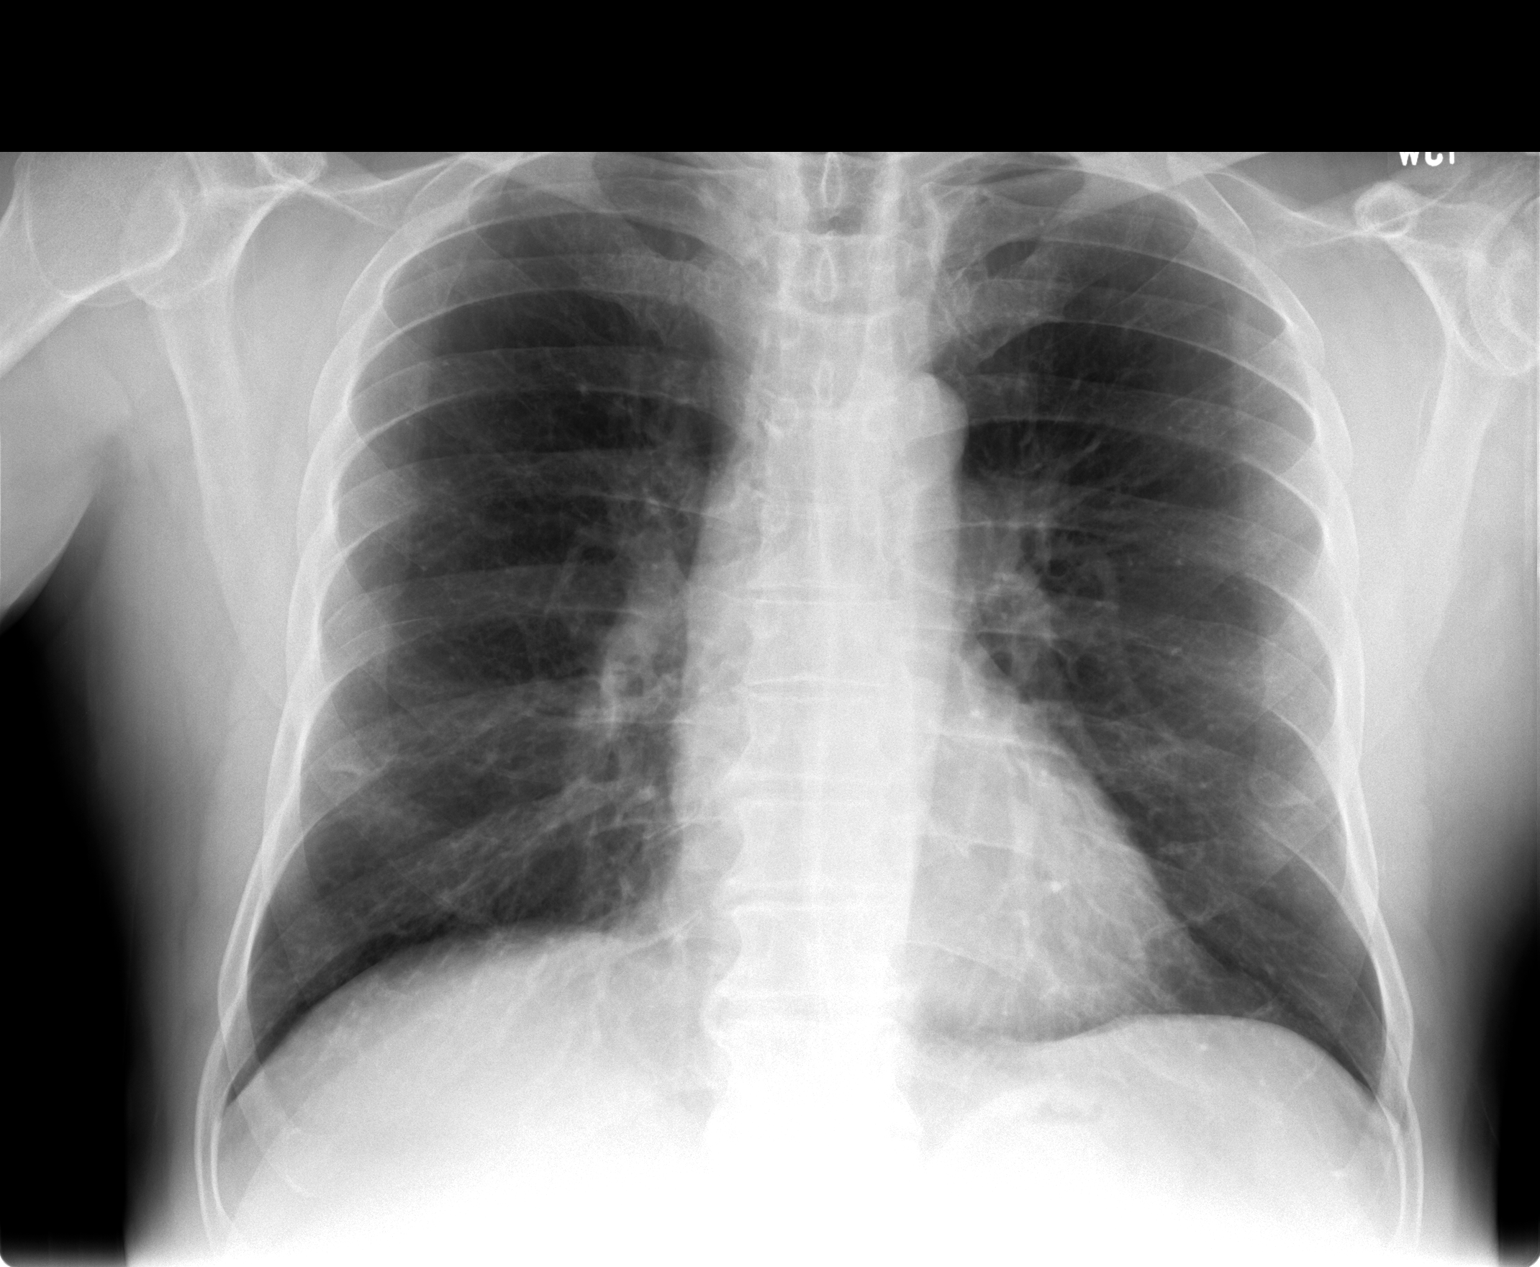

[view not recorded (2 of 2)]
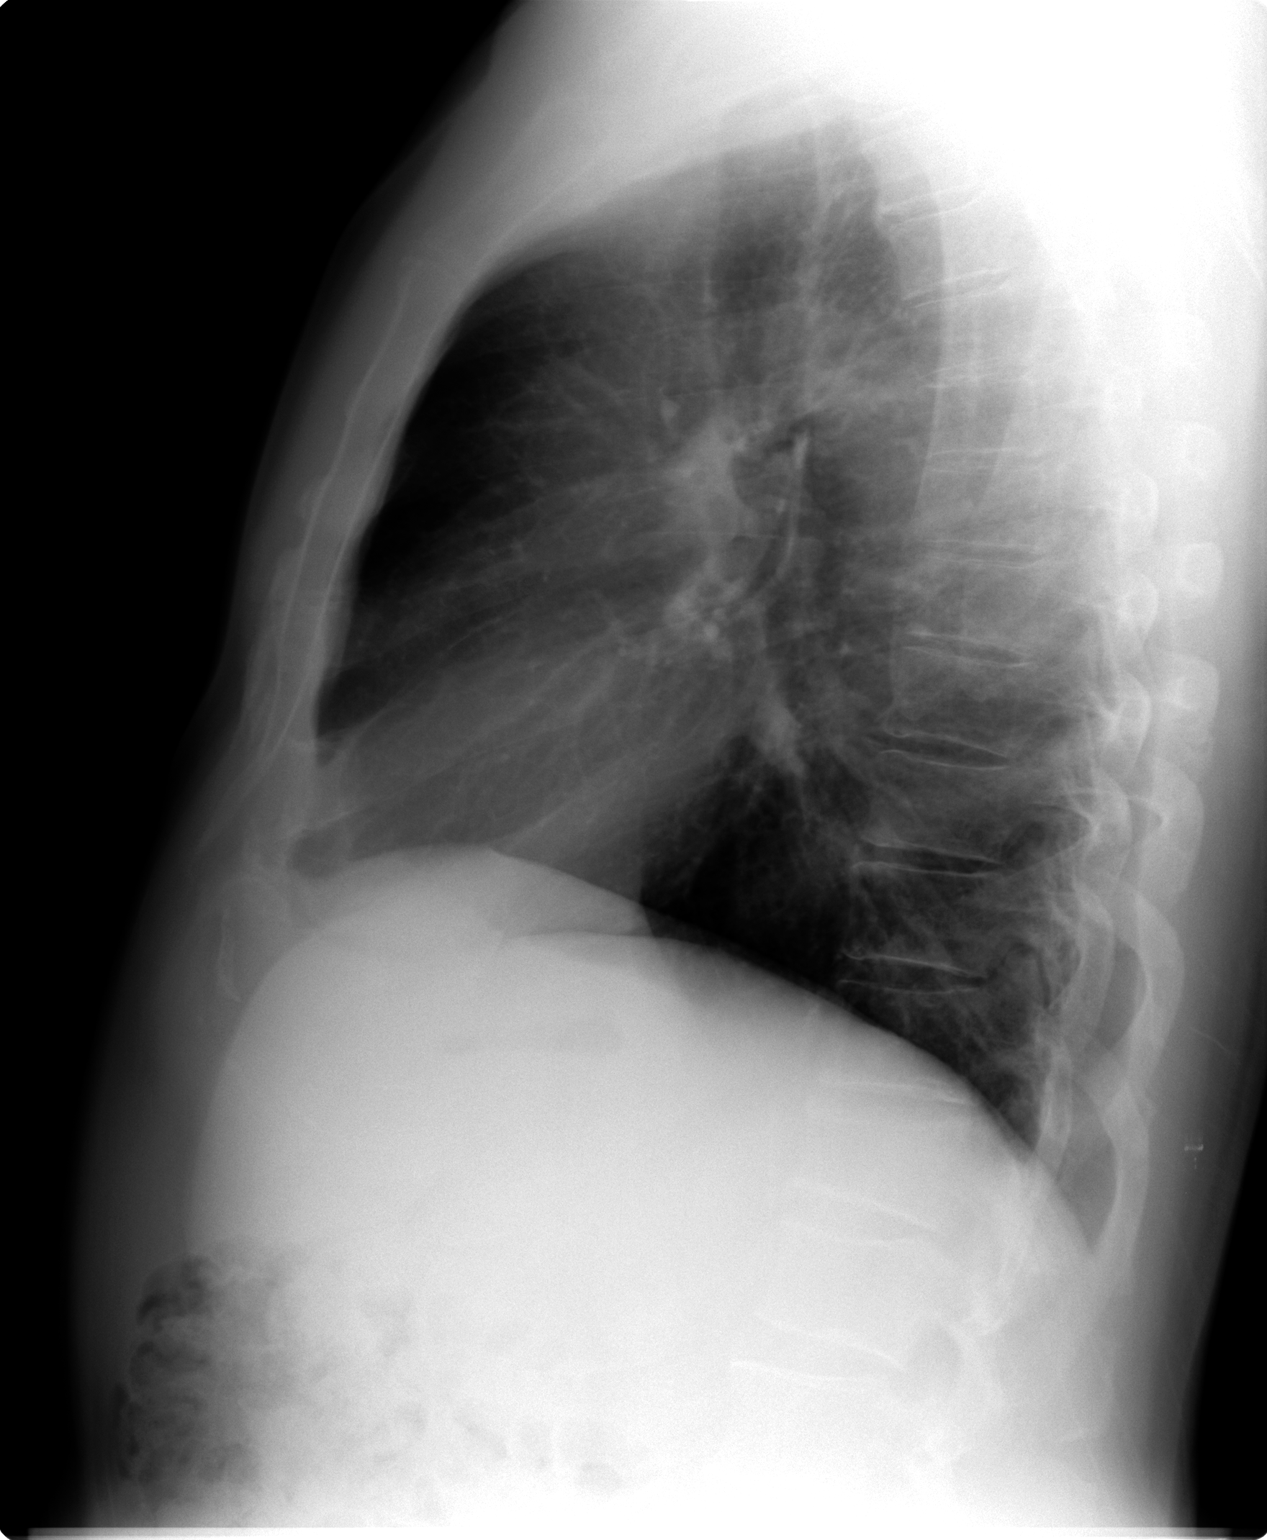

[2 of 2 positions shown; findings below may reference images not displayed]

FINDINGS: Lungs are clear. Heart size and pulmonary vascularity are normal. No
adenopathy. There is mild degenerative change in the thoracic spine.
There is slight anterior wedging of the L1 vertebral body.
IMPRESSION: No edema or consolidation.

## 2017-01-07 ENCOUNTER — Telehealth: Payer: Self-pay | Admitting: Family Medicine

## 2017-01-07 DIAGNOSIS — E7800 Pure hypercholesterolemia, unspecified: Secondary | ICD-10-CM

## 2017-01-07 DIAGNOSIS — R972 Elevated prostate specific antigen [PSA]: Secondary | ICD-10-CM

## 2017-01-07 DIAGNOSIS — I10 Essential (primary) hypertension: Secondary | ICD-10-CM

## 2017-01-07 DIAGNOSIS — R7309 Other abnormal glucose: Secondary | ICD-10-CM

## 2017-01-07 DIAGNOSIS — E039 Hypothyroidism, unspecified: Secondary | ICD-10-CM

## 2017-01-07 NOTE — Telephone Encounter (Signed)
-----   Message from Ellamae Sia sent at 01/03/2017  4:17 PM EST ----- Regarding: Lab orders for Friday, 12.7.18 Patient is scheduled for CPX labs, please order future labs, Thanks , Karna Christmas

## 2017-01-12 ENCOUNTER — Other Ambulatory Visit (INDEPENDENT_AMBULATORY_CARE_PROVIDER_SITE_OTHER): Payer: BLUE CROSS/BLUE SHIELD

## 2017-01-12 DIAGNOSIS — E7800 Pure hypercholesterolemia, unspecified: Secondary | ICD-10-CM | POA: Diagnosis not present

## 2017-01-12 DIAGNOSIS — R7309 Other abnormal glucose: Secondary | ICD-10-CM

## 2017-01-12 DIAGNOSIS — E039 Hypothyroidism, unspecified: Secondary | ICD-10-CM

## 2017-01-12 DIAGNOSIS — R972 Elevated prostate specific antigen [PSA]: Secondary | ICD-10-CM

## 2017-01-12 DIAGNOSIS — I10 Essential (primary) hypertension: Secondary | ICD-10-CM

## 2017-01-12 LAB — CBC WITH DIFFERENTIAL/PLATELET
BASOS PCT: 0.4 % (ref 0.0–3.0)
Basophils Absolute: 0 10*3/uL (ref 0.0–0.1)
EOS ABS: 0.1 10*3/uL (ref 0.0–0.7)
Eosinophils Relative: 2 % (ref 0.0–5.0)
HCT: 50.2 % (ref 39.0–52.0)
HEMOGLOBIN: 17.2 g/dL — AB (ref 13.0–17.0)
LYMPHS ABS: 1.7 10*3/uL (ref 0.7–4.0)
Lymphocytes Relative: 26 % (ref 12.0–46.0)
MCHC: 34.2 g/dL (ref 30.0–36.0)
MCV: 94.7 fl (ref 78.0–100.0)
MONO ABS: 0.4 10*3/uL (ref 0.1–1.0)
Monocytes Relative: 6.9 % (ref 3.0–12.0)
NEUTROS ABS: 4.2 10*3/uL (ref 1.4–7.7)
NEUTROS PCT: 64.7 % (ref 43.0–77.0)
PLATELETS: 204 10*3/uL (ref 150.0–400.0)
RBC: 5.3 Mil/uL (ref 4.22–5.81)
RDW: 13.7 % (ref 11.5–15.5)
WBC: 6.4 10*3/uL (ref 4.0–10.5)

## 2017-01-12 LAB — COMPREHENSIVE METABOLIC PANEL
ALT: 26 U/L (ref 0–53)
AST: 22 U/L (ref 0–37)
Albumin: 4.8 g/dL (ref 3.5–5.2)
Alkaline Phosphatase: 66 U/L (ref 39–117)
BUN: 11 mg/dL (ref 6–23)
CHLORIDE: 102 meq/L (ref 96–112)
CO2: 33 meq/L — AB (ref 19–32)
CREATININE: 1 mg/dL (ref 0.40–1.50)
Calcium: 9.8 mg/dL (ref 8.4–10.5)
GFR: 80.49 mL/min (ref >60.00)
GLUCOSE: 101 mg/dL — AB (ref 70–99)
Potassium: 4.5 mEq/L (ref 3.5–5.1)
SODIUM: 141 meq/L (ref 135–145)
Total Bilirubin: 1 mg/dL (ref 0.2–1.2)
Total Protein: 6.7 g/dL (ref 6.0–8.3)

## 2017-01-12 LAB — LIPID PANEL
CHOL/HDL RATIO: 6
Cholesterol: 198 mg/dL (ref 0–200)
HDL: 34.2 mg/dL — ABNORMAL LOW (ref >39.00)
LDL CALC: 138 mg/dL — AB (ref 0–99)
NonHDL: 163.6
TRIGLYCERIDES: 128 mg/dL (ref 0.0–149.0)
VLDL: 25.6 mg/dL (ref 0.0–40.0)

## 2017-01-12 LAB — TSH: TSH: 7.72 u[IU]/mL — ABNORMAL HIGH (ref 0.35–4.50)

## 2017-01-12 LAB — PSA: PSA: 2.79 ng/mL (ref 0.10–4.00)

## 2017-01-12 LAB — HEMOGLOBIN A1C: Hgb A1c MFr Bld: 5.2 % (ref 4.6–6.5)

## 2017-01-17 ENCOUNTER — Ambulatory Visit (INDEPENDENT_AMBULATORY_CARE_PROVIDER_SITE_OTHER): Payer: BLUE CROSS/BLUE SHIELD | Admitting: Family Medicine

## 2017-01-17 ENCOUNTER — Encounter: Payer: Self-pay | Admitting: Family Medicine

## 2017-01-17 VITALS — BP 124/78 | HR 61 | Temp 98.2°F | Ht 67.0 in | Wt 183.2 lb

## 2017-01-17 DIAGNOSIS — I10 Essential (primary) hypertension: Secondary | ICD-10-CM

## 2017-01-17 DIAGNOSIS — Z0000 Encounter for general adult medical examination without abnormal findings: Secondary | ICD-10-CM | POA: Diagnosis not present

## 2017-01-17 DIAGNOSIS — E039 Hypothyroidism, unspecified: Secondary | ICD-10-CM

## 2017-01-17 DIAGNOSIS — E7800 Pure hypercholesterolemia, unspecified: Secondary | ICD-10-CM

## 2017-01-17 DIAGNOSIS — Z125 Encounter for screening for malignant neoplasm of prostate: Secondary | ICD-10-CM

## 2017-01-17 MED ORDER — LEVOTHYROXINE SODIUM 112 MCG PO TABS: 112.0000 ug | ORAL_TABLET | Freq: Every day | ORAL | 3 refills | Status: DC

## 2017-01-17 MED ORDER — BENAZEPRIL-HYDROCHLOROTHIAZIDE 10-12.5 MG PO TABS: 1.0000 | ORAL_TABLET | Freq: Every day | ORAL | 3 refills | Status: DC

## 2017-01-17 NOTE — Assessment & Plan Note (Signed)
Disc goals for lipids and reasons to control them Rev labs with pt Rev low sat fat diet in detail LDL is stable at 138 Fish oil/exercise for HDL  Disc lower trans/sat fat diet

## 2017-01-17 NOTE — Assessment & Plan Note (Signed)
Lab Results  Component Value Date   PSA 2.79 01/12/2017   PSA 3.03 07/21/2016   PSA 3.05 01/13/2016   Clinically stable Continue to watch for symptoms

## 2017-01-17 NOTE — Assessment & Plan Note (Signed)
Reviewed health habits including diet and exercise and skin cancer prevention Reviewed appropriate screening tests for age  Also reviewed health mt list, fam hx and immunization status , as well as social and family history   See HPI Labs reviewed  Will inc levothyroxine and re check TSH 6 wk Enc wt loss and healthy diet/exercise  Continue to follow PSA  Baseline high Hgb- suggest blood donation  Disc shingrix- he will call pharmacy about avail and coverage

## 2017-01-17 NOTE — Assessment & Plan Note (Signed)
Hypothyroidism  Pt has no clinical changes No change in energy level/ hair or skin/ edema and no tremor Lab Results  Component Value Date   TSH 7.72 (H) 01/12/2017     Will inc levothyroxine to 112 mcg daily from 100  Alert if symptoms  Re check tsh in 6 wk

## 2017-01-17 NOTE — Patient Instructions (Addendum)
There is a new Shingles vaccine called shingrix (pharmacies are just getting it back after backorder for a year) It looks to be a big improvement from the prior vaccine  Call your pharmacy regarding coverage and availability    We will increase your dose of levothyroxine to 112 mcg daily  Will need to check labs in 6 weeks  Sending to [facility] for now   Cholesterol is mildly high  Avoid red meat/ fried foods/ egg yolks/ fatty breakfast meats/ butter, cheese and high fat dairy/ and shellfish    You are a good candidate to donate blood - think about doing this!

## 2017-01-17 NOTE — Assessment & Plan Note (Signed)
bp in fair control at this time  BP Readings from Last 1 Encounters:  01/17/17 124/78   No changes needed Disc lifstyle change with low sodium diet and exercise   Labs reviewed
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
## 2017-02-28 ENCOUNTER — Telehealth: Payer: Self-pay | Admitting: Family Medicine

## 2017-02-28 ENCOUNTER — Telehealth: Payer: Self-pay | Admitting: *Deleted

## 2017-02-28 ENCOUNTER — Other Ambulatory Visit (INDEPENDENT_AMBULATORY_CARE_PROVIDER_SITE_OTHER): Payer: BLUE CROSS/BLUE SHIELD

## 2017-02-28 DIAGNOSIS — E039 Hypothyroidism, unspecified: Secondary | ICD-10-CM | POA: Diagnosis not present

## 2017-02-28 LAB — TSH: TSH: 0.93 u[IU]/mL (ref 0.35–4.50)

## 2017-02-28 NOTE — Telephone Encounter (Signed)
Pt came in today wanting to get his thyroid med sent [facility] if it is going to be the same rx has last rx.  If med changes please send to [facility] cap.  Please advise pt when this has been done Best number [phone]

## 2017-02-28 NOTE — Telephone Encounter (Signed)
We have to wait until his labs come back from today to see what dose he will be on

## 2017-02-28 NOTE — Telephone Encounter (Signed)
Please refill times one  F/u if no imp

## 2017-02-28 NOTE — Telephone Encounter (Signed)
Pt left a note at the front desk asking for a refill of benzonatate, last time I can see it was filled was 06/07/16 #30 with 0 refills. The note just says "need refill for benzonatate 200mg  for cough from allergies"   Pharmacy is [facility], [place]

## 2017-03-01 MED ORDER — LEVOTHYROXINE SODIUM 112 MCG PO TABS: 112.0000 ug | ORAL_TABLET | Freq: Every day | ORAL | 1 refills | Status: DC

## 2017-03-01 MED ORDER — BENZONATATE 200 MG PO CAPS: 200.0000 mg | ORAL_CAPSULE | Freq: Two times a day (BID) | ORAL | 1 refills | Status: DC | PRN

## 2017-03-01 NOTE — Telephone Encounter (Signed)
Labs were normal so Rx sent to mail order pharmacy and pt notified

## 2017-03-01 NOTE — Telephone Encounter (Signed)
Pt notified Rx sent to pharmacy and advise to f/u if no improvement. For now he thinks it's allergies, he has a dry cough, some watery eyes an a little clear nasal drainage. I did advise pt if sxs don't improve then to f/u up.

## 2017-03-05 ENCOUNTER — Other Ambulatory Visit: Payer: BLUE CROSS/BLUE SHIELD

## 2017-05-14 ENCOUNTER — Telehealth: Payer: Self-pay | Admitting: Family Medicine

## 2017-05-14 NOTE — Telephone Encounter (Signed)
Rx (16mcg) was sent in on 03/01/17 #90 with 1 additional refill. To soon so left VM letting pt know Rx was sent and to check with pharmacy

## 2017-05-14 NOTE — Telephone Encounter (Signed)
Pt requesting refill for levothyroxine.  Did his prescribed dosage stay at 100 mcg or up  112 mcg? His last thyroid level was .90  on 02/28/17 Provider: Jerilynn Mages. Tower, MD LOV 01/17/17 Please review.

## 2017-05-14 NOTE — Telephone Encounter (Signed)
Copied from [place]. Topic: Quick Communication - Rx Refill/Question >> May 14, 2017  8:29 AM Ahmed Prima L wrote: Medication: levothyroxine (SYNTHROID, LEVOTHROID) 112 MCG tablet Has the patient contacted their pharmacy? no refills (Agent: If no, request that the patient contact the pharmacy for the refill.) Preferred Pharmacy (with phone number or street name): [facility], [place] Agent: Please be advised that RX refills may take up to 3 business days. We ask that you follow-up with your pharmacy.

## 2017-06-08 ENCOUNTER — Other Ambulatory Visit: Payer: Self-pay | Admitting: Family Medicine

## 2017-06-08 NOTE — Telephone Encounter (Signed)
Copied from [place] [id number]. Topic: Quick Communication - Rx Refill/Question >> Jun 08, 2017  2:03 PM Cleaster Corin, [place] wrote: Medication: benazepril-hydrochlorthiazide (LOTENSIN HCT) 10-12.5 MG tablet [[id number]]  Has the patient contacted their pharmacy? yes (Agent: If no, request that the patient contact the pharmacy for the refill.) Preferred Pharmacy (with phone number or street name): [place], [place] [address] [place] [address] Phone: [phone] Fax: [phone]   Agent: Please be advised that RX refills may take up to 3 business days. We ask that you follow-up with your pharmacy.

## 2017-06-11 MED ORDER — BENAZEPRIL-HYDROCHLOROTHIAZIDE 10-12.5 MG PO TABS: 1.0000 | ORAL_TABLET | Freq: Every day | ORAL | 2 refills | Status: DC

## 2017-06-11 NOTE — Telephone Encounter (Signed)
Benazepril-hydrochlorthiazide 10-12.5 mg tab   Last refill was 01/17/17 for #90 tabs Old pharmacy  [place] refill to new pharmacy  [place]  01/17/17 NOV 01/21/18  Provider: Loura Pardon

## 2017-06-11 NOTE — Telephone Encounter (Signed)
Per DPR left v/m that refill for benazepril -HCTZ was sent to [facility] as requested.

## 2017-08-05 ENCOUNTER — Other Ambulatory Visit: Payer: Self-pay | Admitting: Family Medicine

## 2018-01-16 ENCOUNTER — Telehealth: Payer: Self-pay | Admitting: Family Medicine

## 2018-01-16 DIAGNOSIS — R7309 Other abnormal glucose: Secondary | ICD-10-CM

## 2018-01-16 DIAGNOSIS — E7800 Pure hypercholesterolemia, unspecified: Secondary | ICD-10-CM

## 2018-01-16 DIAGNOSIS — E039 Hypothyroidism, unspecified: Secondary | ICD-10-CM

## 2018-01-16 DIAGNOSIS — Z125 Encounter for screening for malignant neoplasm of prostate: Secondary | ICD-10-CM

## 2018-01-16 DIAGNOSIS — I10 Essential (primary) hypertension: Secondary | ICD-10-CM

## 2018-01-16 NOTE — Telephone Encounter (Signed)
-----   Message from Lendon Collar, RT sent at 01/08/2018 10:42 AM EST ----- Regarding: Lab orders for Thursday Dec 12th Please enter CPE lab orders for 01/17/18. Thanks!

## 2018-01-17 ENCOUNTER — Other Ambulatory Visit (INDEPENDENT_AMBULATORY_CARE_PROVIDER_SITE_OTHER): Payer: BLUE CROSS/BLUE SHIELD

## 2018-01-17 DIAGNOSIS — R7309 Other abnormal glucose: Secondary | ICD-10-CM

## 2018-01-17 DIAGNOSIS — Z125 Encounter for screening for malignant neoplasm of prostate: Secondary | ICD-10-CM

## 2018-01-17 DIAGNOSIS — E039 Hypothyroidism, unspecified: Secondary | ICD-10-CM | POA: Diagnosis not present

## 2018-01-17 DIAGNOSIS — I10 Essential (primary) hypertension: Secondary | ICD-10-CM

## 2018-01-17 DIAGNOSIS — E7800 Pure hypercholesterolemia, unspecified: Secondary | ICD-10-CM

## 2018-01-17 LAB — TSH: TSH: 1.03 u[IU]/mL (ref 0.35–4.50)

## 2018-01-17 LAB — COMPREHENSIVE METABOLIC PANEL
ALT: 25 U/L (ref 0–53)
AST: 22 U/L (ref 0–37)
Albumin: 4.8 g/dL (ref 3.5–5.2)
Alkaline Phosphatase: 72 U/L (ref 39–117)
BILIRUBIN TOTAL: 0.9 mg/dL (ref 0.2–1.2)
BUN: 11 mg/dL (ref 6–23)
CO2: 33 mEq/L — ABNORMAL HIGH (ref 19–32)
Calcium: 10.2 mg/dL (ref 8.4–10.5)
Chloride: 103 mEq/L (ref 96–112)
Creatinine, Ser: 1.01 mg/dL (ref 0.40–1.50)
GFR: 79.31 mL/min (ref >60.00)
Glucose, Bld: 106 mg/dL — ABNORMAL HIGH (ref 70–99)
Potassium: 4.4 mEq/L (ref 3.5–5.1)
Sodium: 140 mEq/L (ref 135–145)
Total Protein: 7 g/dL (ref 6.0–8.3)

## 2018-01-17 LAB — LIPID PANEL
Cholesterol: 199 mg/dL (ref 0–200)
HDL: 35.9 mg/dL — ABNORMAL LOW (ref >39.00)
LDL CALC: 143 mg/dL — AB (ref 0–99)
NonHDL: 162.87
Total CHOL/HDL Ratio: 6
Triglycerides: 101 mg/dL (ref 0.0–149.0)
VLDL: 20.2 mg/dL (ref 0.0–40.0)

## 2018-01-17 LAB — CBC WITH DIFFERENTIAL/PLATELET
BASOS ABS: 0 10*3/uL (ref 0.0–0.1)
Basophils Relative: 0.5 % (ref 0.0–3.0)
Eosinophils Absolute: 0.2 10*3/uL (ref 0.0–0.7)
Eosinophils Relative: 3.9 % (ref 0.0–5.0)
HCT: 50.8 % (ref 39.0–52.0)
Hemoglobin: 17.6 g/dL — ABNORMAL HIGH (ref 13.0–17.0)
Lymphocytes Relative: 24 % (ref 12.0–46.0)
Lymphs Abs: 1.5 10*3/uL (ref 0.7–4.0)
MCHC: 34.6 g/dL (ref 30.0–36.0)
MCV: 93.5 fl (ref 78.0–100.0)
Monocytes Absolute: 0.4 10*3/uL (ref 0.1–1.0)
Monocytes Relative: 5.8 % (ref 3.0–12.0)
Neutro Abs: 4 10*3/uL (ref 1.4–7.7)
Neutrophils Relative %: 65.8 % (ref 43.0–77.0)
Platelets: 201 10*3/uL (ref 150.0–400.0)
RBC: 5.43 Mil/uL (ref 4.22–5.81)
RDW: 13.6 % (ref 11.5–15.5)
WBC: 6.1 10*3/uL (ref 4.0–10.5)

## 2018-01-17 LAB — PSA: PSA: 3.62 ng/mL (ref 0.10–4.00)

## 2018-01-17 LAB — HEMOGLOBIN A1C: Hgb A1c MFr Bld: 5.2 % (ref 4.6–6.5)

## 2018-01-21 ENCOUNTER — Ambulatory Visit (INDEPENDENT_AMBULATORY_CARE_PROVIDER_SITE_OTHER): Payer: BLUE CROSS/BLUE SHIELD | Admitting: Family Medicine

## 2018-01-21 ENCOUNTER — Encounter: Payer: Self-pay | Admitting: Family Medicine

## 2018-01-21 VITALS — BP 128/85 | HR 70 | Temp 98.1°F | Ht 66.75 in | Wt 177.5 lb

## 2018-01-21 DIAGNOSIS — R7309 Other abnormal glucose: Secondary | ICD-10-CM | POA: Diagnosis not present

## 2018-01-21 DIAGNOSIS — E7800 Pure hypercholesterolemia, unspecified: Secondary | ICD-10-CM

## 2018-01-21 DIAGNOSIS — E039 Hypothyroidism, unspecified: Secondary | ICD-10-CM | POA: Diagnosis not present

## 2018-01-21 DIAGNOSIS — Z0000 Encounter for general adult medical examination without abnormal findings: Secondary | ICD-10-CM

## 2018-01-21 DIAGNOSIS — I10 Essential (primary) hypertension: Secondary | ICD-10-CM | POA: Diagnosis not present

## 2018-01-21 DIAGNOSIS — Z125 Encounter for screening for malignant neoplasm of prostate: Secondary | ICD-10-CM

## 2018-01-21 DIAGNOSIS — R972 Elevated prostate specific antigen [PSA]: Secondary | ICD-10-CM

## 2018-01-21 MED ORDER — LEVOTHYROXINE SODIUM 112 MCG PO TABS: 112.0000 ug | ORAL_TABLET | Freq: Every day | ORAL | 3 refills | Status: DC

## 2018-01-21 MED ORDER — BENAZEPRIL-HYDROCHLOROTHIAZIDE 10-12.5 MG PO TABS: 1.0000 | ORAL_TABLET | Freq: Every day | ORAL | 3 refills | Status: DC

## 2018-01-21 NOTE — Assessment & Plan Note (Signed)
Hypothyroidism  Pt has no clinical changes No change in energy level/ hair or skin/ edema and no tremor Lab Results  Component Value Date   TSH 1.03 01/17/2018

## 2018-01-21 NOTE — Assessment & Plan Note (Signed)
Lab Results  Component Value Date   PSA 3.62 01/17/2018   PSA 2.79 01/12/2017   PSA 3.03 07/21/2016   Some symptoms of BPH  Enlarged prostate on exam  Will re check psa 6 mo  Consider alpha blocker if needed for symptoms in the future

## 2018-01-21 NOTE — Assessment & Plan Note (Signed)
Disc goals for lipids and reasons to control them Rev last labs with pt Rev low sat fat diet in detail  LDL up  Suspect genetic  Recommend statin- pt declines for now  Diet is fairly good  Continue to follow

## 2018-01-21 NOTE — Progress Notes (Signed)
Subjective:    Patient ID: Carlos Larson, male    DOB: 1955-12-26, 62 y.o.   MRN: [id number]  HPI Here for health maintenance exam and to review chronic medical problems    Feeling ok overall  He inc his vit D to 2000 iu daily  Drinking smoothie - fruit/veggie/nuts  Overall healthier eating  Drinking lemon water  Has tried a little apple cider vinegar     Wt Readings from Last 3 Encounters:  01/21/18 177 lb 8 oz (80.5 kg)  01/17/17 183 lb 4 oz (83.1 kg)  06/01/16 183 lb (83 kg)  down 6 lb -he has cut back on his sugar intake (quit soda)  Exercise - walking 5 d per week 20 min or more and also uses weight machine  28.01 kg/m    Flu vaccine -oct  Tetanus vaccine 7/11  Colonoscopy 3/18 with 5 y recall (father had colon cancer)  Zoster status -not vaccinated  Prostate health H/o BPH Lab Results  Component Value Date   PSA 3.62 01/17/2018   PSA 2.79 01/12/2017   PSA 3.03 07/21/2016   nocturia once per night  More urgency lately  Flow seems about the same   bp is stable today  No cp or palpitations or headaches or edema  No side effects to medicines  BP Readings from Last 3 Encounters:  01/21/18 128/90  01/17/17 124/78  06/01/16 (!) 146/92       Lab Results  Component Value Date   CREATININE 1.01 01/17/2018   BUN 11 01/17/2018   NA 140 01/17/2018   K 4.4 01/17/2018   CL 103 01/17/2018   CO2 33 (H) 01/17/2018   Lab Results  Component Value Date   ALT 25 01/17/2018   AST 22 01/17/2018   ALKPHOS 72 01/17/2018   BILITOT 0.9 01/17/2018   Hypothyroidism  Pt has no clinical changes No change in energy level/ hair or skin/ edema and no tremor Lab Results  Component Value Date   TSH 1.03 01/17/2018     H/o glucose elevation  Lab Results  Component Value Date   HGBA1C 5.2 01/17/2018   Hyperlipidemia  Lab Results  Component Value Date   CHOL 199 01/17/2018   CHOL 198 01/12/2017   CHOL 181 01/13/2016   Lab Results  Component Value Date   HDL 35.90 (L) 01/17/2018   HDL 34.20 (L) 01/12/2017   HDL 30.50 (L) 01/13/2016   Lab Results  Component Value Date   LDLCALC 143 (H) 01/17/2018   LDLCALC 138 (H) 01/12/2017   LDLCALC 138 (H) 01/05/2015   Lab Results  Component Value Date   TRIG 101.0 01/17/2018   TRIG 128.0 01/12/2017   TRIG 213.0 (H) 01/13/2016   Lab Results  Component Value Date   CHOLHDL 6 01/17/2018   CHOLHDL 6 01/12/2017   CHOLHDL 6 01/13/2016   Lab Results  Component Value Date   LDLDIRECT 103.0 01/13/2016   LDLDIRECT 80.4 11/25/2012   LDLDIRECT 156.2 12/01/2011   He does avoid fatty/greasy food  No beef  Rarely fried foods  Once in a while - biscuit  He declines medication   Patient Active Problem List   Diagnosis Date Noted  . Elevated random blood glucose level 01/17/2016  . PSA elevation 01/17/2016  . Hyperlipidemia 01/12/2014  . Hematuria 12/02/2012  . Routine general medical examination at a health care facility 12/26/2010  . Prostate cancer screening 12/26/2010  . Family history of colon cancer 12/26/2010  . Hypothyroidism 06/22/2009  .  Essential hypertension 06/22/2009   Past Medical History:  Diagnosis Date  . Cataract   . HTN (hypertension)   . Hyperlipidemia   . Hypothyroid   . Pollen allergies    mild   Past Surgical History:  Procedure Laterality Date  . COLONOSCOPY  01/12/2006, 02/10/2011   2007 - Normal (Dr. Michail Sermon) 2013 - mild diverticulosis Carlean Purl)  . TONSILLECTOMY  1971   and adnoids   Social History   Tobacco Use  . Smoking status: Former Smoker    Last attempt to quit: 02/07/1972    Years since quitting: 45.9  . Smokeless tobacco: Never Used  . Tobacco comment: Smoked for 4 years  Substance Use Topics  . Alcohol use: No    Alcohol/week: 0.0 standard drinks  . Drug use: No   Family History  Problem Relation Age of Onset  . Colon cancer Father 81  . Hypertension Father   . Heart disease Mother        valvular disease  . Hypertension Mother   .  Prostate cancer Maternal Grandfather   . Hypertension Paternal Grandfather   . Coronary artery disease Maternal Grandmother   . Diabetes Neg Hx    No Known Allergies Current Outpatient Medications on File Prior to Visit  Medication Sig Dispense Refill  . Cholecalciferol (VITAMIN D) 50 MCG (2000 UT) tablet Take 2,000 Units by mouth daily.    . fluticasone (FLONASE) 50 MCG/ACT nasal spray SPRAY 1 SPRAY INTO EACH NOSTRIL TWICE A DAY (Patient taking differently: Place 1 spray into both nostrils daily as needed. ) 16 g 1  . Omega-3 Fatty Acids (FISH OIL) 1200 MG CPDR Take 1 capsule by mouth.     No current facility-administered medications on file prior to visit.     Review of Systems  Constitutional: Negative for activity change, appetite change, fatigue, fever and unexpected weight change.  HENT: Negative for congestion, rhinorrhea, sore throat and trouble swallowing.   Eyes: Negative for pain, redness, itching and visual disturbance.  Respiratory: Negative for cough, chest tightness, shortness of breath and wheezing.   Cardiovascular: Negative for chest pain and palpitations.  Gastrointestinal: Negative for abdominal pain, blood in stool, constipation, diarrhea and nausea.  Endocrine: Negative for cold intolerance, heat intolerance, polydipsia and polyuria.  Genitourinary: Positive for urgency. Negative for difficulty urinating, dysuria and frequency.  Musculoskeletal: Negative for arthralgias, joint swelling and myalgias.  Skin: Negative for pallor and rash.  Neurological: Negative for dizziness, tremors, weakness, numbness and headaches.  Hematological: Negative for adenopathy. Does not bruise/bleed easily.  Psychiatric/Behavioral: Negative for decreased concentration and dysphoric mood. The patient is not nervous/anxious.        Objective:   Physical Exam Constitutional:      General: He is not in acute distress.    Appearance: Normal appearance. He is well-developed and normal  weight. He is not ill-appearing.  HENT:     Head: Normocephalic and atraumatic.     Right Ear: External ear normal.     Left Ear: External ear normal.     Nose: Nose normal.  Eyes:     General: No scleral icterus.       Right eye: No discharge.        Left eye: No discharge.     Conjunctiva/sclera: Conjunctivae normal.     Pupils: Pupils are equal, round, and reactive to light.  Neck:     Musculoskeletal: Normal range of motion and neck supple.     Thyroid: No thyromegaly.  Vascular: No carotid bruit or JVD.  Cardiovascular:     Rate and Rhythm: Normal rate and regular rhythm.     Heart sounds: Normal heart sounds. No gallop.   Pulmonary:     Effort: Pulmonary effort is normal. No respiratory distress.     Breath sounds: Normal breath sounds. No wheezing or rales.  Abdominal:     General: Bowel sounds are normal. There is no distension or abdominal bruit.     Palpations: Abdomen is soft. There is no mass.     Tenderness: There is no abdominal tenderness.  Genitourinary:    Prostate: Enlarged. Not tender and no nodules present.     Rectum: Normal.     Comments: Prostate is enlarged/ symmetric/nt and firm  Musculoskeletal: Normal range of motion.        General: No tenderness.  Lymphadenopathy:     Cervical: No cervical adenopathy.  Skin:    General: Skin is warm and dry.     Capillary Refill: Capillary refill takes less than 2 seconds.     Coloration: Skin is not pale.     Findings: No erythema or rash.  Neurological:     General: No focal deficit present.     Mental Status: He is alert. Mental status is at baseline.     Cranial Nerves: No cranial nerve deficit.     Motor: No abnormal muscle tone.     Coordination: Coordination normal.     Deep Tendon Reflexes: Reflexes are normal and symmetric.  Psychiatric:        Mood and Affect: Mood normal.           Assessment & Plan:   Problem List Items Addressed This Visit      Cardiovascular and Mediastinum    Essential hypertension    bp in fair control at this time  BP Readings from Last 1 Encounters:  01/21/18 128/85   No changes needed Most recent labs reviewed  Disc lifstyle change with low sodium diet and exercise        Relevant Medications   benazepril-hydrochlorthiazide (LOTENSIN HCT) 10-12.5 MG tablet     Endocrine   Hypothyroidism    Hypothyroidism  Pt has no clinical changes No change in energy level/ hair or skin/ edema and no tremor Lab Results  Component Value Date   TSH 1.03 01/17/2018          Relevant Medications   levothyroxine (SYNTHROID, LEVOTHROID) 112 MCG tablet     Other   Routine general medical examination at a health care facility - Primary    Reviewed health habits including diet and exercise and skin cancer prevention Reviewed appropriate screening tests for age  Also reviewed health mt list, fam hx and immunization status , as well as social and family history   See HPI Labs reviewed  utd colon screening Following psa  Recommend statin for cholesterol Good health habits Flu shot today      PSA elevation    Re check 6 mo  Suspect due to BPH      Prostate cancer screening    Lab Results  Component Value Date   PSA 3.62 01/17/2018   PSA 2.79 01/12/2017   PSA 3.03 07/21/2016   Some symptoms of BPH  Enlarged prostate on exam  Will re check psa 6 mo  Consider alpha blocker if needed for symptoms in the future       Hyperlipidemia    Disc goals for lipids and reasons  to control them Rev last labs with pt Rev low sat fat diet in detail  LDL up  Suspect genetic  Recommend statin- pt declines for now  Diet is fairly good  Continue to follow       Relevant Medications   benazepril-hydrochlorthiazide (LOTENSIN HCT) 10-12.5 MG tablet   Elevated random blood glucose level    Lab Results  Component Value Date   HGBA1C 5.2 01/17/2018   disc imp of low glycemic diet and wt loss to prevent DM2  Fasting glucose 106

## 2018-01-21 NOTE — Patient Instructions (Addendum)
Take a look at the [facility] eating plan  Try to get most of your carbohydrates from produce (with the exception of white potatoes)  Eat less bread/pasta/rice/snack foods/cereals/sweets and other items from the middle of the grocery store (processed carbs)   Let us know if /when you want to try cholesterol medication  I think your cholesterol is a genetic problem   Prostate (PSA) is up again a bit This is likely due to enlarging prostate with time  I want to re check your psa again in about 6 months   Your blood count tends to be on the high side  Please consider donating blood regularly   Take care of yourself

## 2018-01-21 NOTE — Assessment & Plan Note (Signed)
Re check 6 mo  Suspect due to BPH

## 2018-01-21 NOTE — Assessment & Plan Note (Signed)
Lab Results  Component Value Date   HGBA1C 5.2 01/17/2018   disc imp of low glycemic diet and wt loss to prevent DM2  Fasting glucose 106

## 2018-01-21 NOTE — Assessment & Plan Note (Signed)
Reviewed health habits including diet and exercise and skin cancer prevention Reviewed appropriate screening tests for age  Also reviewed health mt list, fam hx and immunization status , as well as social and family history   See HPI Labs reviewed  utd colon screening Following psa  Recommend statin for cholesterol Good health habits Flu shot today

## 2018-01-21 NOTE — Assessment & Plan Note (Signed)
bp in fair control at this time  BP Readings from Last 1 Encounters:  01/21/18 128/85   No changes needed Most recent labs reviewed  Disc lifstyle change with low sodium diet and exercise

## 2018-02-02 ENCOUNTER — Other Ambulatory Visit: Payer: Self-pay | Admitting: Family Medicine

## 2018-07-22 ENCOUNTER — Telehealth: Payer: Self-pay | Admitting: Family Medicine

## 2018-07-22 DIAGNOSIS — R7309 Other abnormal glucose: Secondary | ICD-10-CM

## 2018-07-22 DIAGNOSIS — R972 Elevated prostate specific antigen [PSA]: Secondary | ICD-10-CM

## 2018-07-22 DIAGNOSIS — E7800 Pure hypercholesterolemia, unspecified: Secondary | ICD-10-CM

## 2018-07-22 NOTE — Telephone Encounter (Signed)
-----   Message from Cloyd Stagers, RT sent at 07/18/2018  3:06 PM EDT ----- Regarding: Lab Orders for Wednesday 6.17.2020 Please place lab orders for Wednesday 6.17.2020, appt note states 6 mo labs Thank you, Dyke Maes RT(R)

## 2018-07-24 ENCOUNTER — Other Ambulatory Visit: Payer: Self-pay

## 2018-07-24 ENCOUNTER — Other Ambulatory Visit (INDEPENDENT_AMBULATORY_CARE_PROVIDER_SITE_OTHER): Payer: BC Managed Care – PPO

## 2018-07-24 DIAGNOSIS — R972 Elevated prostate specific antigen [PSA]: Secondary | ICD-10-CM | POA: Diagnosis not present

## 2018-07-24 LAB — PSA: PSA: 3.6 ng/mL (ref 0.10–4.00)

## 2018-10-18 ENCOUNTER — Ambulatory Visit (INDEPENDENT_AMBULATORY_CARE_PROVIDER_SITE_OTHER): Payer: BC Managed Care – PPO

## 2018-10-18 DIAGNOSIS — Z23 Encounter for immunization: Secondary | ICD-10-CM | POA: Diagnosis not present

## 2018-11-05 ENCOUNTER — Ambulatory Visit (INDEPENDENT_AMBULATORY_CARE_PROVIDER_SITE_OTHER): Payer: BC Managed Care – PPO | Admitting: Family Medicine

## 2018-11-05 ENCOUNTER — Encounter: Payer: Self-pay | Admitting: Family Medicine

## 2018-11-05 ENCOUNTER — Telehealth: Payer: Self-pay

## 2018-11-05 DIAGNOSIS — J0110 Acute frontal sinusitis, unspecified: Secondary | ICD-10-CM

## 2018-11-05 DIAGNOSIS — J019 Acute sinusitis, unspecified: Secondary | ICD-10-CM | POA: Insufficient documentation

## 2018-11-05 MED ORDER — AMOXICILLIN-POT CLAVULANATE 875-125 MG PO TABS: 1.0000 | ORAL_TABLET | Freq: Two times a day (BID) | ORAL | 0 refills | Status: DC

## 2018-11-05 NOTE — Patient Instructions (Signed)
Drink fluids and rest  Take augmentin as directed for sinus infection  mucinex and flonase are fine  Stop sudafed   Update if not starting to improve in a week or if worsening  Watch for worse cough or shortness of breath or the covid symptoms we discussed (including fever) and let us know  We can get you tested for covid if needed  Stay isolated until you are better

## 2018-11-05 NOTE — Telephone Encounter (Signed)
I spoke with pt; for 1 wk pt has had sinus pressure and H/A above eyes and when blows nose mucus is clear to yellow and has pink tinge on and off.No dizziness. Pt has non prod cough, has sinus drainage,S/T 2 days last wk,Pt has no covid symptoms except h/a,S/Tand cough, no travel and no known exposure to + covid. Pt has been taking sudafed and mucinex. Pt request abx. Pt cannot do virtual appt (has flip phone)the patient scheduled phone visit today at 4:15. Pt will have vital signs available this afternoon.

## 2018-11-05 NOTE — Progress Notes (Signed)
Virtual Visit via Telephone Note  I connected with Carlos Larson on 11/05/18 at  4:15 PM EDT by telephone and verified that I am speaking with the correct person using two identifiers.  Location: Patient: home Provider: office    I discussed the limitations, risks, security and privacy concerns of performing an evaluation and management service by telephone and the availability of in person appointments. I also discussed with the patient that there may be a patient responsible charge related to this service. The patient expressed understanding and agreed to proceed.   History of Present Illness: Pt presents with congestion and cough   Thinks he has a sinus infection   Ragweed allergies are worse lately (ragweed)  Uses zyrtec Started with a ST- 10 days ago  Then runny nose got worse with congestion  Nasal mucous is yellow with blood in it  Pain high in nasal bridge and headache above eyes on and off  Face is not tender or red or swollen   L eye is draining - has a tear duct issue A little worse  No itching  No ear pain   Cough- is dry/hacking  Once in a while scant phlegm  No sob or wheezing   No fever No chills or body aches   Still walks a mile every am  otc mucinex plain Sudafed   No covid contacts Stay at home  Does not think he wants to get tested      Patient Active Problem List   Diagnosis Date Noted  . Acute sinusitis 11/05/2018  . Elevated random blood glucose level 01/17/2016  . PSA elevation 01/17/2016  . Hyperlipidemia 01/12/2014  . Hematuria 12/02/2012  . Routine general medical examination at a health care facility 12/26/2010  . Prostate cancer screening 12/26/2010  . Family history of colon cancer 12/26/2010  . Hypothyroidism 06/22/2009  . Essential hypertension 06/22/2009   Past Medical History:  Diagnosis Date  . Cataract   . HTN (hypertension)   . Hyperlipidemia   . Hypothyroid   . Pollen allergies    mild   Past Surgical  History:  Procedure Laterality Date  . COLONOSCOPY  01/12/2006, 02/10/2011   2007 - Normal (Dr. Michail Sermon) 2013 - mild diverticulosis Carlean Purl)  . TONSILLECTOMY  1971   and adnoids   Social History   Tobacco Use  . Smoking status: Former Smoker    Quit date: 02/07/1972    Years since quitting: 46.7  . Smokeless tobacco: Never Used  . Tobacco comment: Smoked for 4 years  Substance Use Topics  . Alcohol use: No    Alcohol/week: 0.0 standard drinks  . Drug use: No   Family History  Problem Relation Age of Onset  . Colon cancer Father 62  . Hypertension Father   . Heart disease Mother        valvular disease  . Hypertension Mother   . Prostate cancer Maternal Grandfather   . Hypertension Paternal Grandfather   . Coronary artery disease Maternal Grandmother   . Diabetes Neg Hx    No Known Allergies Current Outpatient Medications on File Prior to Visit  Medication Sig Dispense Refill  . benazepril-hydrochlorthiazide (LOTENSIN HCT) 10-12.5 MG tablet Take 1 tablet by mouth daily. 90 tablet 3  . Cholecalciferol (VITAMIN D) 50 MCG (2000 UT) tablet Take 2,000 Units by mouth daily.    . fluticasone (FLONASE) 50 MCG/ACT nasal spray SPRAY 1 SPRAY INTO EACH NOSTRIL TWICE A DAY (Patient taking differently: Place 1 spray into  both nostrils daily as needed. ) 16 g 1  . levothyroxine (SYNTHROID, LEVOTHROID) 112 MCG tablet Take 1 tablet (112 mcg total) by mouth daily before breakfast. 90 tablet 3  . Omega-3 Fatty Acids (FISH OIL) 1200 MG CPDR Take 1 capsule by mouth.     No current facility-administered medications on file prior to visit.    Review of Systems  Constitutional: Positive for malaise/fatigue. Negative for chills and fever.  HENT: Positive for congestion and sinus pain. Negative for ear discharge, ear pain and sore throat.   Eyes: Negative for blurred vision, discharge and redness.  Respiratory: Positive for cough. Negative for sputum production, shortness of breath and wheezing.    Cardiovascular: Negative for chest pain.  Gastrointestinal: Negative for abdominal pain, diarrhea, nausea and vomiting.  Skin: Negative for rash.  Neurological: Positive for headaches. Negative for dizziness.    Observations/Objective: Patient sounds congested (nasal voice) Nl affect -good mood Nl cognition, good historian No hearing difficulty  No cough or sob during speech  States pressing on face does not hurt States face is not red or swollen today    Assessment and Plan: Problem List Items Addressed This Visit      Respiratory   Acute sinusitis    S/p several weeks of allergy congestion with facial pain and purulent nasal drainage  No fever  Reviewed covid symptoms to watch for  augmentin sent to pharmacy  Continue mucinex and flonase Update if not starting to improve in a week or if worsening  If no imp /worse or new symptoms will call us to set up covid screening  inst to isolate until symptoms resolve      Relevant Medications   amoxicillin-clavulanate (AUGMENTIN) 875-125 MG tablet       Follow Up Instructions: Drink fluids and rest  Take augmentin as directed for sinus infection  mucinex and flonase are fine  Stop sudafed   Update if not starting to improve in a week or if worsening  Watch for worse cough or shortness of breath or the covid symptoms we discussed (including fever) and let us know  We can get you tested for covid if needed  Stay isolated until you are better    I discussed the assessment and treatment plan with the patient. The patient was provided an opportunity to ask questions and all were answered. The patient agreed with the plan and demonstrated an understanding of the instructions.   The patient was advised to call back or seek an in-person evaluation if the symptoms worsen or if the condition fails to improve as anticipated.     Loura Pardon, MD

## 2018-11-05 NOTE — Assessment & Plan Note (Signed)
S/p several weeks of allergy congestion with facial pain and purulent nasal drainage  No fever  Reviewed covid symptoms to watch for  augmentin sent to pharmacy  Continue mucinex and flonase Update if not starting to improve in a week or if worsening  If no imp /worse or new symptoms will call us to set up covid screening  inst to isolate until symptoms resolve

## 2018-11-05 NOTE — Telephone Encounter (Signed)
I will talk to him then

## 2019-01-21 ENCOUNTER — Telehealth: Payer: Self-pay | Admitting: Family Medicine

## 2019-01-21 DIAGNOSIS — E039 Hypothyroidism, unspecified: Secondary | ICD-10-CM

## 2019-01-21 DIAGNOSIS — I10 Essential (primary) hypertension: Secondary | ICD-10-CM

## 2019-01-21 DIAGNOSIS — Z125 Encounter for screening for malignant neoplasm of prostate: Secondary | ICD-10-CM

## 2019-01-21 DIAGNOSIS — R739 Hyperglycemia, unspecified: Secondary | ICD-10-CM

## 2019-01-21 DIAGNOSIS — R972 Elevated prostate specific antigen [PSA]: Secondary | ICD-10-CM

## 2019-01-21 DIAGNOSIS — R7309 Other abnormal glucose: Secondary | ICD-10-CM

## 2019-01-21 DIAGNOSIS — E7800 Pure hypercholesterolemia, unspecified: Secondary | ICD-10-CM

## 2019-01-21 NOTE — Telephone Encounter (Signed)
-----   Message from Ellamae Sia sent at 01/14/2019  3:19 PM EST ----- Regarding: lab orders for Wednesday, 12.16.20 Patient is scheduled for CPX labs, please order future labs, Thanks , Karna Christmas

## 2019-01-22 ENCOUNTER — Other Ambulatory Visit: Payer: Self-pay

## 2019-01-22 ENCOUNTER — Other Ambulatory Visit (INDEPENDENT_AMBULATORY_CARE_PROVIDER_SITE_OTHER): Payer: BC Managed Care – PPO

## 2019-01-22 DIAGNOSIS — Z125 Encounter for screening for malignant neoplasm of prostate: Secondary | ICD-10-CM

## 2019-01-22 DIAGNOSIS — I10 Essential (primary) hypertension: Secondary | ICD-10-CM | POA: Diagnosis not present

## 2019-01-22 DIAGNOSIS — R7309 Other abnormal glucose: Secondary | ICD-10-CM | POA: Diagnosis not present

## 2019-01-22 DIAGNOSIS — E039 Hypothyroidism, unspecified: Secondary | ICD-10-CM | POA: Diagnosis not present

## 2019-01-22 DIAGNOSIS — E7800 Pure hypercholesterolemia, unspecified: Secondary | ICD-10-CM

## 2019-01-22 DIAGNOSIS — R972 Elevated prostate specific antigen [PSA]: Secondary | ICD-10-CM | POA: Diagnosis not present

## 2019-01-22 LAB — CBC WITH DIFFERENTIAL/PLATELET
Basophils Absolute: 0 10*3/uL (ref 0.0–0.1)
Basophils Relative: 0.3 % (ref 0.0–3.0)
Eosinophils Absolute: 0.2 10*3/uL (ref 0.0–0.7)
Eosinophils Relative: 3.2 % (ref 0.0–5.0)
HCT: 49.7 % (ref 39.0–52.0)
Hemoglobin: 16.9 g/dL (ref 13.0–17.0)
Lymphocytes Relative: 22.7 % (ref 12.0–46.0)
Lymphs Abs: 1.6 10*3/uL (ref 0.7–4.0)
MCHC: 34.1 g/dL (ref 30.0–36.0)
MCV: 94.3 fl (ref 78.0–100.0)
Monocytes Absolute: 0.4 10*3/uL (ref 0.1–1.0)
Monocytes Relative: 6.3 % (ref 3.0–12.0)
Neutro Abs: 4.7 10*3/uL (ref 1.4–7.7)
Neutrophils Relative %: 67.5 % (ref 43.0–77.0)
Platelets: 215 10*3/uL (ref 150.0–400.0)
RBC: 5.27 Mil/uL (ref 4.22–5.81)
RDW: 13.5 % (ref 11.5–15.5)
WBC: 6.9 10*3/uL (ref 4.0–10.5)

## 2019-01-22 LAB — LIPID PANEL
Cholesterol: 206 mg/dL — ABNORMAL HIGH (ref 0–200)
HDL: 35.5 mg/dL — ABNORMAL LOW (ref >39.00)
LDL Cholesterol: 144 mg/dL — ABNORMAL HIGH (ref 0–99)
NonHDL: 170.16
Total CHOL/HDL Ratio: 6
Triglycerides: 130 mg/dL (ref 0.0–149.0)
VLDL: 26 mg/dL (ref 0.0–40.0)

## 2019-01-22 LAB — COMPREHENSIVE METABOLIC PANEL
ALT: 24 U/L (ref 0–53)
AST: 21 U/L (ref 0–37)
Albumin: 4.7 g/dL (ref 3.5–5.2)
Alkaline Phosphatase: 68 U/L (ref 39–117)
BUN: 13 mg/dL (ref 6–23)
CO2: 33 mEq/L — ABNORMAL HIGH (ref 19–32)
Calcium: 10 mg/dL (ref 8.4–10.5)
Chloride: 103 mEq/L (ref 96–112)
Creatinine, Ser: 1 mg/dL (ref 0.40–1.50)
GFR: 75.24 mL/min (ref >60.00)
Glucose, Bld: 104 mg/dL — ABNORMAL HIGH (ref 70–99)
Potassium: 4.4 mEq/L (ref 3.5–5.1)
Sodium: 141 mEq/L (ref 135–145)
Total Bilirubin: 1 mg/dL (ref 0.2–1.2)
Total Protein: 6.9 g/dL (ref 6.0–8.3)

## 2019-01-22 LAB — PSA: PSA: 4.07 ng/mL — ABNORMAL HIGH (ref 0.10–4.00)

## 2019-01-22 LAB — TSH: TSH: 0.84 u[IU]/mL (ref 0.35–4.50)

## 2019-01-22 LAB — HEMOGLOBIN A1C: Hgb A1c MFr Bld: 5.2 % (ref 4.6–6.5)

## 2019-01-24 ENCOUNTER — Ambulatory Visit (INDEPENDENT_AMBULATORY_CARE_PROVIDER_SITE_OTHER): Payer: BC Managed Care – PPO | Admitting: Family Medicine

## 2019-01-24 ENCOUNTER — Other Ambulatory Visit: Payer: Self-pay

## 2019-01-24 ENCOUNTER — Encounter: Payer: Self-pay | Admitting: Family Medicine

## 2019-01-24 VITALS — BP 110/82 | HR 65 | Temp 98.0°F | Resp 14 | Ht 67.0 in | Wt 178.4 lb

## 2019-01-24 DIAGNOSIS — Z23 Encounter for immunization: Secondary | ICD-10-CM

## 2019-01-24 DIAGNOSIS — I10 Essential (primary) hypertension: Secondary | ICD-10-CM

## 2019-01-24 DIAGNOSIS — Z800 Family history of malignant neoplasm of digestive organs: Secondary | ICD-10-CM

## 2019-01-24 DIAGNOSIS — E7800 Pure hypercholesterolemia, unspecified: Secondary | ICD-10-CM

## 2019-01-24 DIAGNOSIS — E039 Hypothyroidism, unspecified: Secondary | ICD-10-CM | POA: Diagnosis not present

## 2019-01-24 DIAGNOSIS — Z0000 Encounter for general adult medical examination without abnormal findings: Secondary | ICD-10-CM

## 2019-01-24 DIAGNOSIS — R7309 Other abnormal glucose: Secondary | ICD-10-CM

## 2019-01-24 DIAGNOSIS — R972 Elevated prostate specific antigen [PSA]: Secondary | ICD-10-CM

## 2019-01-24 MED ORDER — BENAZEPRIL-HYDROCHLOROTHIAZIDE 10-12.5 MG PO TABS: 1.0000 | ORAL_TABLET | Freq: Every day | ORAL | 3 refills | Status: DC

## 2019-01-24 MED ORDER — LEVOTHYROXINE SODIUM 112 MCG PO TABS: 112.0000 ug | ORAL_TABLET | Freq: Every day | ORAL | 3 refills | Status: DC

## 2019-01-24 NOTE — Patient Instructions (Addendum)
For cholesterol  Avoid red meat/ fried foods/ egg yolks/ fatty breakfast meats/ butter, cheese and high fat dairy/ and shellfish    I would consider a statin medication for cholesterol like generic crestor or liptior  Goal for LDL is below 130 (below 100 is better)  It does not seem to be changing despite good diet  Think about it and let me know if you want to try one   I think you are due for a colonoscopy in march of 2021  You can call us or GI office to schedule it close to then   shingrix vaccine today   Stay active and take care of yourself

## 2019-01-24 NOTE — Progress Notes (Signed)
Subjective:    Patient ID: Carlos Larson, male    DOB: Jul 18, 1955, 63 y.o.   MRN: [id number]  This visit occurred during the SARS-CoV-2 public health emergency.  Safety protocols were in place, including screening questions prior to the visit, additional usage of staff PPE, and extensive cleaning of exam room while observing appropriate contact time as indicated for disinfecting solutions.    HPI Here for health maintenance exam and to review chronic medical problems    Wt Readings from Last 3 Encounters:  01/24/19 178 lb 7 oz (80.9 kg)  01/21/18 177 lb 8 oz (80.5 kg)  01/17/17 183 lb 4 oz (83.1 kg)  was walking a mile daily -not right now  He also [place] hunts  Uses his weight machine at home  27.95 kg/m   Lost father to blood disorder/ may have had a heart attack  He was 36  Doing ok with grief  A lot of work with the estate   Feeling ok overall   Equilibrium is off- just for a second with change in position  Nothing severe  No falls (it does not take off balance)    Td 7/11  Colonoscopy 3/18 with 5 y recall  (due in march)  Father had colon cancer at age 64   Flu vaccine 9/20   Zoster status - shingrix -wants first one today/ he checked coverage    Prostate health  Does urinate a little more frequently  Nocturia once per night  Stream - no problems  Lab Results  Component Value Date   PSA 4.07 (H) 01/22/2019   PSA 3.60 07/24/2018   PSA 3.62 01/17/2018    MGF with prostate cancer    Hypertension   BP Readings from Last 3 Encounters:  01/24/19 110/82  01/21/18 128/85  01/17/17 124/78   Pulse Readings from Last 3 Encounters:  01/24/19 65  01/21/18 70  01/17/17 61   Cholesterol Lab Results  Component Value Date   CHOL 206 (H) 01/22/2019   CHOL 199 01/17/2018   CHOL 198 01/12/2017   Lab Results  Component Value Date   HDL 35.50 (L) 01/22/2019   HDL 35.90 (L) 01/17/2018   HDL 34.20 (L) 01/12/2017   Lab Results  Component Value Date   LDLCALC 144 (H) 01/22/2019   LDLCALC 143 (H) 01/17/2018   LDLCALC 138 (H) 01/12/2017   Lab Results  Component Value Date   TRIG 130.0 01/22/2019   TRIG 101.0 01/17/2018   TRIG 128.0 01/12/2017   Lab Results  Component Value Date   CHOLHDL 6 01/22/2019   CHOLHDL 6 01/17/2018   CHOLHDL 6 01/12/2017   Lab Results  Component Value Date   LDLDIRECT 103.0 01/13/2016   LDLDIRECT 80.4 11/25/2012   LDLDIRECT 156.2 12/01/2011   ? If father had MI  He does avoid fried foods (more greens and salads lately)  Very seldom eats red meat  Lean pork  No shellfish  No breakfast pork  occ cheese -not a lot  No ice cream    H/o elevated glucose Lab Results  Component Value Date   HGBA1C 5.2 01/22/2019  very stable -does not change   Lab Results  Component Value Date   CREATININE 1.00 01/22/2019   BUN 13 01/22/2019   NA 141 01/22/2019   K 4.4 01/22/2019   CL 103 01/22/2019   CO2 33 (H) 01/22/2019   Lab Results  Component Value Date   ALT 24 01/22/2019   AST 21 01/22/2019  ALKPHOS 68 01/22/2019   BILITOT 1.0 01/22/2019    Lab Results  Component Value Date   WBC 6.9 01/22/2019   HGB 16.9 01/22/2019   HCT 49.7 01/22/2019   MCV 94.3 01/22/2019   PLT 215.0 01/22/2019    Lab Results  Component Value Date   TSH 0.84 01/22/2019    Patient Active Problem List   Diagnosis Date Noted  . Elevated random blood glucose level 01/17/2016  . PSA elevation 01/17/2016  . Hyperlipidemia 01/12/2014  . Hematuria 12/02/2012  . Routine general medical examination at a health care facility 12/26/2010  . Prostate cancer screening 12/26/2010  . Family history of colon cancer 12/26/2010  . Hypothyroidism 06/22/2009  . Essential hypertension 06/22/2009   Past Medical History:  Diagnosis Date  . Cataract   . HTN (hypertension)   . Hyperlipidemia   . Hypothyroid   . Pollen allergies    mild   Past Surgical History:  Procedure Laterality Date  . COLONOSCOPY  01/12/2006, 02/10/2011    2007 - Normal (Dr. Michail Sermon) 2013 - mild diverticulosis Carlean Purl)  . TONSILLECTOMY  1971   and adnoids   Social History   Tobacco Use  . Smoking status: Former Smoker    Quit date: 02/07/1972    Years since quitting: 47.0  . Smokeless tobacco: Never Used  . Tobacco comment: Smoked for 4 years  Substance Use Topics  . Alcohol use: No    Alcohol/week: 0.0 standard drinks  . Drug use: No   Family History  Problem Relation Age of Onset  . Colon cancer Father 61  . Hypertension Father   . Heart disease Mother        valvular disease  . Hypertension Mother   . Prostate cancer Maternal Grandfather   . Hypertension Paternal Grandfather   . Coronary artery disease Maternal Grandmother   . Diabetes Neg Hx    No Known Allergies Current Outpatient Medications on File Prior to Visit  Medication Sig Dispense Refill  . Cholecalciferol (VITAMIN D) 50 MCG (2000 UT) tablet Take 2,000 Units by mouth daily.    . fluticasone (FLONASE) 50 MCG/ACT nasal spray SPRAY 1 SPRAY INTO EACH NOSTRIL TWICE A DAY (Patient taking differently: Place 1 spray into both nostrils daily as needed. ) 16 g 1  . Omega-3 Fatty Acids (FISH OIL) 1200 MG CPDR Take 1 capsule by mouth.     No current facility-administered medications on file prior to visit.    Review of Systems  Constitutional: Negative for activity change, appetite change, fatigue, fever and unexpected weight change.  HENT: Negative for congestion, rhinorrhea, sore throat and trouble swallowing.   Eyes: Negative for pain, redness, itching and visual disturbance.  Respiratory: Negative for cough, chest tightness, shortness of breath and wheezing.   Cardiovascular: Negative for chest pain and palpitations.  Gastrointestinal: Negative for abdominal pain, blood in stool, constipation, diarrhea and nausea.  Endocrine: Negative for cold intolerance, heat intolerance, polydipsia and polyuria.  Genitourinary: Negative for difficulty urinating, dysuria, frequency  and urgency.  Musculoskeletal: Negative for arthralgias, joint swelling and myalgias.  Skin: Negative for pallor and rash.  Neurological: Negative for dizziness, tremors, weakness, numbness and headaches.  Hematological: Negative for adenopathy. Does not bruise/bleed easily.  Psychiatric/Behavioral: Negative for decreased concentration and dysphoric mood. The patient is not nervous/anxious.        Objective:   Physical Exam Constitutional:      General: He is not in acute distress.    Appearance: Normal appearance. He is  well-developed and normal weight. He is not ill-appearing or diaphoretic.  HENT:     Head: Normocephalic and atraumatic.     Right Ear: Tympanic membrane, ear canal and external ear normal.     Left Ear: Tympanic membrane, ear canal and external ear normal.     Nose: Nose normal. No congestion.     Mouth/Throat:     Mouth: Mucous membranes are moist.     Pharynx: Oropharynx is clear. No posterior oropharyngeal erythema.  Eyes:     General: No scleral icterus.       Right eye: No discharge.        Left eye: No discharge.     Conjunctiva/sclera: Conjunctivae normal.     Pupils: Pupils are equal, round, and reactive to light.  Neck:     Thyroid: No thyromegaly.     Vascular: No carotid bruit or JVD.  Cardiovascular:     Rate and Rhythm: Normal rate and regular rhythm.     Pulses: Normal pulses.     Heart sounds: Normal heart sounds. No gallop.   Pulmonary:     Effort: Pulmonary effort is normal. No respiratory distress.     Breath sounds: Normal breath sounds. No wheezing or rales.     Comments: Good air exch Chest:     Chest wall: No tenderness.  Abdominal:     General: Bowel sounds are normal. There is no distension or abdominal bruit.     Palpations: Abdomen is soft. There is no mass.     Tenderness: There is no abdominal tenderness.     Hernia: No hernia is present.  Musculoskeletal:        General: No tenderness.     Cervical back: Normal range of  motion and neck supple. No rigidity. No muscular tenderness.     Right lower leg: No edema.     Left lower leg: No edema.  Lymphadenopathy:     Cervical: No cervical adenopathy.  Skin:    General: Skin is warm and dry.     Coloration: Skin is not pale.     Findings: No erythema or rash.     Comments: Solar lentigines diffusely   Neurological:     Mental Status: He is alert.     Cranial Nerves: No cranial nerve deficit.     Motor: No abnormal muscle tone.     Coordination: Coordination normal.     Gait: Gait normal.     Deep Tendon Reflexes: Reflexes are normal and symmetric. Reflexes normal.  Psychiatric:        Mood and Affect: Mood normal.        Cognition and Memory: Cognition and memory normal.           Assessment & Plan:   Problem List Items Addressed This Visit      Cardiovascular and Mediastinum   Essential hypertension    bp in fair control at this time  BP Readings from Last 1 Encounters:  01/24/19 110/82   No changes needed Most recent labs reviewed  Disc lifstyle change with low sodium diet and exercise        Relevant Medications   benazepril-hydrochlorthiazide (LOTENSIN HCT) 10-12.5 MG tablet     Endocrine   Hypothyroidism    Hypothyroidism  Pt has no clinical changes No change in energy level/ hair or skin/ edema and no tremor Lab Results  Component Value Date   TSH 0.84 01/22/2019  Relevant Medications   levothyroxine (SYNTHROID) 112 MCG tablet     Other   Routine general medical examination at a health care facility - Primary    Reviewed health habits including diet and exercise and skin cancer prevention Reviewed appropriate screening tests for age  Also reviewed health mt list, fam hx and immunization status , as well as social and family history   See HPI Labs reviewed  Ref to urology for elevated psa /may be due to BPH First shingrix vaccine today Urged to consider statin for cholesterol  Due for colonoscopy 3/21  Pt  is aware        Family history of colon cancer    Due for 5 y recall colonoscopy in 3/21 Pt is aware inst to call us or GI office to schedule      Hyperlipidemia    Disc goals for lipids and reasons to control them Rev last labs with pt Rev low sat fat diet in detail  With optimal diet LDL is 144 Also some CAD in family  Enc pt to consider statin for this - disc pros and cons He will think about it and let us know       Relevant Medications   benazepril-hydrochlorthiazide (LOTENSIN HCT) 10-12.5 MG tablet   Elevated random blood glucose level    Lab Results  Component Value Date   HGBA1C 5.2 01/22/2019   disc imp of low glycemic diet and wt loss to prevent DM2       PSA elevation    PSA jumped to 4.07 May be due to BPH- but not a lot of clinical change Ref to urology to eval further       Relevant Orders   Ambulatory referral to Urology    Other Visit Diagnoses    Need for shingles vaccine       Relevant Orders   Varicella-zoster vaccine IM (Shingrix) (Completed)

## 2019-01-26 NOTE — Assessment & Plan Note (Addendum)
Reviewed health habits including diet and exercise and skin cancer prevention Reviewed appropriate screening tests for age  Also reviewed health mt list, fam hx and immunization status , as well as social and family history   See HPI Labs reviewed  Ref to urology for elevated psa /may be due to BPH First shingrix vaccine today Urged to consider statin for cholesterol  Due for colonoscopy 3/21  Pt is aware

## 2019-01-26 NOTE — Assessment & Plan Note (Signed)
Lab Results  Component Value Date   HGBA1C 5.2 01/22/2019   disc imp of low glycemic diet and wt loss to prevent DM2

## 2019-01-26 NOTE — Assessment & Plan Note (Signed)
Due for 5 y recall colonoscopy in 3/21 Pt is aware inst to call us or GI office to schedule

## 2019-01-26 NOTE — Assessment & Plan Note (Signed)
Hypothyroidism  Pt has no clinical changes No change in energy level/ hair or skin/ edema and no tremor Lab Results  Component Value Date   TSH 0.84 01/22/2019

## 2019-01-26 NOTE — Assessment & Plan Note (Signed)
bp in fair control at this time  BP Readings from Last 1 Encounters:  01/24/19 110/82   No changes needed Most recent labs reviewed  Disc lifstyle change with low sodium diet and exercise

## 2019-01-26 NOTE — Assessment & Plan Note (Signed)
Disc goals for lipids and reasons to control them Rev last labs with pt Rev low sat fat diet in detail  With optimal diet LDL is 144 Also some CAD in family  Enc pt to consider statin for this - disc pros and cons He will think about it and let us know

## 2019-01-26 NOTE — Assessment & Plan Note (Signed)
PSA jumped to 4.07 May be due to BPH- but not a lot of clinical change Ref to urology to eval further

## 2019-02-19 DIAGNOSIS — R972 Elevated prostate specific antigen [PSA]: Secondary | ICD-10-CM | POA: Diagnosis not present

## 2019-02-19 DIAGNOSIS — N400 Enlarged prostate without lower urinary tract symptoms: Secondary | ICD-10-CM | POA: Diagnosis not present

## 2019-04-08 ENCOUNTER — Other Ambulatory Visit: Payer: Self-pay

## 2019-04-08 ENCOUNTER — Ambulatory Visit (INDEPENDENT_AMBULATORY_CARE_PROVIDER_SITE_OTHER): Payer: BC Managed Care – PPO | Admitting: *Deleted

## 2019-04-08 DIAGNOSIS — Z23 Encounter for immunization: Secondary | ICD-10-CM

## 2019-04-08 NOTE — Progress Notes (Signed)
Per orders of Dr. Glori Bickers, injection of Shingrix #2 given by Virl Cagey. No other vaccines in the last 14 days.  Patient tolerated injection well.

## 2019-05-22 DIAGNOSIS — N400 Enlarged prostate without lower urinary tract symptoms: Secondary | ICD-10-CM | POA: Diagnosis not present

## 2019-06-05 ENCOUNTER — Encounter: Payer: Self-pay | Admitting: Family Medicine

## 2019-06-05 ENCOUNTER — Other Ambulatory Visit: Payer: Self-pay

## 2019-06-05 ENCOUNTER — Telehealth: Payer: Self-pay | Admitting: Family Medicine

## 2019-06-05 ENCOUNTER — Ambulatory Visit (INDEPENDENT_AMBULATORY_CARE_PROVIDER_SITE_OTHER): Payer: BC Managed Care – PPO | Admitting: Family Medicine

## 2019-06-05 DIAGNOSIS — R21 Rash and other nonspecific skin eruption: Secondary | ICD-10-CM

## 2019-06-05 NOTE — Progress Notes (Signed)
Subjective:    Patient ID: Carlos Larson, male    DOB: February 18, 1955, 65 y.o.   MRN: [id number]  This visit occurred during the SARS-CoV-2 public health emergency.  Safety protocols were in place, including screening questions prior to the visit, additional usage of staff PPE, and extensive cleaning of exam room while observing appropriate contact time as indicated for disinfecting solutions.    HPI Pt presents with c/o tingly feeling on back/concerned it could be shingles   Wt Readings from Last 3 Encounters:  06/05/19 180 lb (81.6 kg)  01/24/19 178 lb 7 oz (80.9 kg)  01/21/18 177 lb 8 oz (80.5 kg)   28.19 kg/m  Tingly sensation when he touches his  Both sides but worse on L  Radiates to shoulder and down back  No rash noted Not itchy   No neck pain    He did finish the shingrix vaccines 04/08/19  Recently tried a new magnesium supplement called "calm"  No tick bites or insect bites  No new foods No new topical products  Does use fabric softener sheets  Patient Active Problem List   Diagnosis Date Noted  . Rash and nonspecific skin eruption 06/05/2019  . Elevated random blood glucose level 01/17/2016  . PSA elevation 01/17/2016  . Hyperlipidemia 01/12/2014  . Hematuria 12/02/2012  . Routine general medical examination at a health care facility 12/26/2010  . Prostate cancer screening 12/26/2010  . Family history of colon cancer 12/26/2010  . Hypothyroidism 06/22/2009  . Essential hypertension 06/22/2009   Past Medical History:  Diagnosis Date  . Cataract   . HTN (hypertension)   . Hyperlipidemia   . Hypothyroid   . Pollen allergies    mild   Past Surgical History:  Procedure Laterality Date  . COLONOSCOPY  01/12/2006, 02/10/2011   2007 - Normal (Dr. Michail Sermon) 2013 - mild diverticulosis Carlean Purl)  . TONSILLECTOMY  1971   and adnoids   Social History   Tobacco Use  . Smoking status: Former Smoker    Quit date: 02/07/1972    Years since quitting: 47.3  .  Smokeless tobacco: Never Used  . Tobacco comment: Smoked for 4 years  Substance Use Topics  . Alcohol use: No    Alcohol/week: 0.0 standard drinks  . Drug use: No   Family History  Problem Relation Age of Onset  . Colon cancer Father 61  . Hypertension Father   . Heart disease Mother        valvular disease  . Hypertension Mother   . Prostate cancer Maternal Grandfather   . Hypertension Paternal Grandfather   . Coronary artery disease Maternal Grandmother   . Diabetes Neg Hx    No Known Allergies Current Outpatient Medications on File Prior to Visit  Medication Sig Dispense Refill  . benazepril-hydrochlorthiazide (LOTENSIN HCT) 10-12.5 MG tablet Take 1 tablet by mouth daily. 90 tablet 3  . Cholecalciferol (VITAMIN D) 50 MCG (2000 UT) tablet Take 2,000 Units by mouth daily.    . fluticasone (FLONASE) 50 MCG/ACT nasal spray SPRAY 1 SPRAY INTO EACH NOSTRIL TWICE A DAY (Patient taking differently: Place 1 spray into both nostrils daily as needed. ) 16 g 1  . levothyroxine (SYNTHROID) 112 MCG tablet Take 1 tablet (112 mcg total) by mouth daily before breakfast. 90 tablet 3  . Omega-3 Fatty Acids (FISH OIL) 1200 MG CPDR Take 1 capsule by mouth.     No current facility-administered medications on file prior to visit.  Review of Systems  Constitutional: Negative for activity change, appetite change, fatigue, fever and unexpected weight change.  HENT: Negative for congestion, rhinorrhea, sore throat and trouble swallowing.   Eyes: Negative for pain, redness, itching and visual disturbance.  Respiratory: Negative for cough, chest tightness, shortness of breath and wheezing.   Cardiovascular: Negative for chest pain and palpitations.  Gastrointestinal: Negative for abdominal pain, blood in stool, constipation, diarrhea and nausea.  Endocrine: Negative for cold intolerance, heat intolerance, polydipsia and polyuria.  Genitourinary: Negative for difficulty urinating, dysuria, frequency  and urgency.  Musculoskeletal: Negative for arthralgias, joint swelling, myalgias and neck pain.  Skin: Positive for rash. Negative for pallor and wound.  Neurological: Negative for dizziness, tremors, weakness, numbness and headaches.       Tingling on area of rash  Hematological: Negative for adenopathy. Does not bruise/bleed easily.  Psychiatric/Behavioral: Negative for decreased concentration and dysphoric mood. The patient is not nervous/anxious.        Objective:   Physical Exam Constitutional:      General: He is not in acute distress.    Appearance: Normal appearance. He is normal weight. He is not ill-appearing or diaphoretic.  HENT:     Head: Normocephalic and atraumatic.  Eyes:     General: No scleral icterus.       Right eye: No discharge.        Left eye: No discharge.     Conjunctiva/sclera: Conjunctivae normal.     Pupils: Pupils are equal, round, and reactive to light.  Cardiovascular:     Rate and Rhythm: Normal rate and regular rhythm.     Heart sounds: Normal heart sounds.  Pulmonary:     Effort: Pulmonary effort is normal.     Breath sounds: Normal breath sounds. No wheezing.  Musculoskeletal:     Cervical back: Normal range of motion and neck supple.     Right lower leg: No edema.     Left lower leg: No edema.  Lymphadenopathy:     Cervical: No cervical adenopathy.  Skin:    General: Skin is warm and dry.     Findings: Rash present.     Comments: Diffuse rash on back and shoulders  Dry, slightly pink and "bumpy" -more texture than papules  No rash on face/neck/chest or ext  No rash in axillae or finger web spaces  No vesicles  No excoriations   Neurological:     Mental Status: He is alert.  Psychiatric:        Mood and Affect: Mood normal.           Assessment & Plan:   Problem List Items Addressed This Visit      Musculoskeletal and Integument   Rash and nonspecific skin eruption    Dry/bumpy/pink rash diffusely on back and top of  shoulders  No tick bites/insect bites No new exposures except a mag supplement called "calm" Has been outdoors -this area covered   This resembles reaction of some sort  inst to avoid hot water/harsh detergents and fragrances  Use non scented moisturizer  Protect from sun Watch for tick bites or any new symptoms like fever  Adv to try otc antihistamine like zyrtec Hold the mag supplement Update if not starting to improve in a week or if worsening

## 2019-06-05 NOTE — Telephone Encounter (Signed)
Aware, will see him then °

## 2019-06-05 NOTE — Assessment & Plan Note (Signed)
Dry/bumpy/pink rash diffusely on back and top of shoulders  No tick bites/insect bites No new exposures except a mag supplement called "calm" Has been outdoors -this area covered   This resembles reaction of some sort  inst to avoid hot water/harsh detergents and fragrances  Use non scented moisturizer  Protect from sun Watch for tick bites or any new symptoms like fever  Adv to try otc antihistamine like zyrtec Hold the mag supplement Update if not starting to improve in a week or if worsening

## 2019-06-05 NOTE — Telephone Encounter (Signed)
Calling in with complaints of possible Shingles.  Pt was vaccinated in Dec/Mar completing the Shingrix series.  Pt states that on Monday he started having a tingling sensation and feeling of a "sunburn" on his upper back, starting at the spine radiating to left side, does not cross the midline.  No visible rash. Pt does report in the first few days having a little "splotchy skin"   Pt scheduled an appt with Dr Glori Bickers today at 11:15  Nothing further needed.

## 2019-06-05 NOTE — Patient Instructions (Addendum)
Stop the magnesium supplement  Protect you skin from the sun  Avoid harsh chemicals and hot water   This looks like an allergic reaction (? Food or medication or pollen)   Try an antihistamine to calm down the rash- zyrtec or claritin or allegra  Store brand is fine   Use scent free products (soap/detergent)  Use "free" brands  Avoid fabric softener of any type   Use moisturizer without fragrance or color - eucerin / lubriderm are fine   If this worsens or moves to other areas let me know  If you develop fever or any other symptoms let me know  If tick bites - also let me know   Update if not starting to improve in a week or if worsening

## 2019-08-13 DIAGNOSIS — N400 Enlarged prostate without lower urinary tract symptoms: Secondary | ICD-10-CM | POA: Diagnosis not present

## 2019-08-20 DIAGNOSIS — R972 Elevated prostate specific antigen [PSA]: Secondary | ICD-10-CM | POA: Diagnosis not present

## 2019-08-20 DIAGNOSIS — N400 Enlarged prostate without lower urinary tract symptoms: Secondary | ICD-10-CM | POA: Diagnosis not present

## 2019-11-14 ENCOUNTER — Telehealth: Payer: Self-pay

## 2019-11-14 MED ORDER — LEVOTHYROXINE SODIUM 112 MCG PO TABS: 112.0000 ug | ORAL_TABLET | Freq: Every day | ORAL | 0 refills | Status: DC

## 2019-11-14 MED ORDER — BENAZEPRIL-HYDROCHLOROTHIAZIDE 10-12.5 MG PO TABS: 1.0000 | ORAL_TABLET | Freq: Every day | ORAL | 0 refills | Status: DC

## 2019-11-14 NOTE — Telephone Encounter (Signed)
Pt called asking to change his 2 meds to [facility] so he can use [facility]. They would not call to transfer his rx. I have sent them in for him.

## 2020-01-12 DIAGNOSIS — H0288B Meibomian gland dysfunction left eye, upper and lower eyelids: Secondary | ICD-10-CM | POA: Diagnosis not present

## 2020-01-12 DIAGNOSIS — H5213 Myopia, bilateral: Secondary | ICD-10-CM | POA: Diagnosis not present

## 2020-01-12 DIAGNOSIS — H2513 Age-related nuclear cataract, bilateral: Secondary | ICD-10-CM | POA: Diagnosis not present

## 2020-01-12 DIAGNOSIS — H0288A Meibomian gland dysfunction right eye, upper and lower eyelids: Secondary | ICD-10-CM | POA: Diagnosis not present

## 2020-01-13 DIAGNOSIS — Z0120 Encounter for dental examination and cleaning without abnormal findings: Secondary | ICD-10-CM | POA: Diagnosis not present

## 2020-01-25 ENCOUNTER — Telehealth: Payer: Self-pay | Admitting: Family Medicine

## 2020-01-25 DIAGNOSIS — I10 Essential (primary) hypertension: Secondary | ICD-10-CM

## 2020-01-25 DIAGNOSIS — E039 Hypothyroidism, unspecified: Secondary | ICD-10-CM

## 2020-01-25 DIAGNOSIS — Z125 Encounter for screening for malignant neoplasm of prostate: Secondary | ICD-10-CM

## 2020-01-25 DIAGNOSIS — Z0000 Encounter for general adult medical examination without abnormal findings: Secondary | ICD-10-CM

## 2020-01-25 DIAGNOSIS — R972 Elevated prostate specific antigen [PSA]: Secondary | ICD-10-CM

## 2020-01-25 DIAGNOSIS — E7800 Pure hypercholesterolemia, unspecified: Secondary | ICD-10-CM

## 2020-01-25 DIAGNOSIS — R7309 Other abnormal glucose: Secondary | ICD-10-CM

## 2020-01-25 NOTE — Telephone Encounter (Signed)
-----   Message from Ellamae Sia sent at 01/12/2020  4:09 PM EST ----- Regarding: Lab orders for Monday, 12.20.21 Patient is scheduled for CPX labs, please order future labs, Thanks , Karna Christmas

## 2020-01-26 ENCOUNTER — Other Ambulatory Visit: Payer: Self-pay

## 2020-01-26 ENCOUNTER — Other Ambulatory Visit (INDEPENDENT_AMBULATORY_CARE_PROVIDER_SITE_OTHER): Payer: Medicare Other

## 2020-01-26 DIAGNOSIS — I10 Essential (primary) hypertension: Secondary | ICD-10-CM

## 2020-01-26 DIAGNOSIS — R739 Hyperglycemia, unspecified: Secondary | ICD-10-CM

## 2020-01-26 DIAGNOSIS — Z125 Encounter for screening for malignant neoplasm of prostate: Secondary | ICD-10-CM | POA: Diagnosis not present

## 2020-01-26 DIAGNOSIS — E039 Hypothyroidism, unspecified: Secondary | ICD-10-CM

## 2020-01-26 DIAGNOSIS — R7309 Other abnormal glucose: Secondary | ICD-10-CM | POA: Diagnosis not present

## 2020-01-26 DIAGNOSIS — E7800 Pure hypercholesterolemia, unspecified: Secondary | ICD-10-CM

## 2020-01-26 DIAGNOSIS — R972 Elevated prostate specific antigen [PSA]: Secondary | ICD-10-CM

## 2020-01-26 LAB — COMPREHENSIVE METABOLIC PANEL
ALT: 21 U/L (ref 0–53)
AST: 19 U/L (ref 0–37)
Albumin: 4.6 g/dL (ref 3.5–5.2)
Alkaline Phosphatase: 68 U/L (ref 39–117)
BUN: 13 mg/dL (ref 6–23)
CO2: 32 mEq/L (ref 19–32)
Calcium: 9.8 mg/dL (ref 8.4–10.5)
Chloride: 103 mEq/L (ref 96–112)
Creatinine, Ser: 1.11 mg/dL (ref 0.40–1.50)
GFR: 69.94 mL/min (ref >60.00)
Glucose, Bld: 101 mg/dL — ABNORMAL HIGH (ref 70–99)
Potassium: 4.2 mEq/L (ref 3.5–5.1)
Sodium: 141 mEq/L (ref 135–145)
Total Bilirubin: 1 mg/dL (ref 0.2–1.2)
Total Protein: 7 g/dL (ref 6.0–8.3)

## 2020-01-26 LAB — CBC WITH DIFFERENTIAL/PLATELET
Basophils Absolute: 0 10*3/uL (ref 0.0–0.1)
Basophils Relative: 0.4 % (ref 0.0–3.0)
Eosinophils Absolute: 0.2 10*3/uL (ref 0.0–0.7)
Eosinophils Relative: 2.5 % (ref 0.0–5.0)
HCT: 49 % (ref 39.0–52.0)
Hemoglobin: 16.9 g/dL (ref 13.0–17.0)
Lymphocytes Relative: 21.4 % (ref 12.0–46.0)
Lymphs Abs: 1.4 10*3/uL (ref 0.7–4.0)
MCHC: 34.5 g/dL (ref 30.0–36.0)
MCV: 92.6 fl (ref 78.0–100.0)
Monocytes Absolute: 0.4 10*3/uL (ref 0.1–1.0)
Monocytes Relative: 5.7 % (ref 3.0–12.0)
Neutro Abs: 4.5 10*3/uL (ref 1.4–7.7)
Neutrophils Relative %: 70 % (ref 43.0–77.0)
Platelets: 201 10*3/uL (ref 150.0–400.0)
RBC: 5.3 Mil/uL (ref 4.22–5.81)
RDW: 13 % (ref 11.5–15.5)
WBC: 6.5 10*3/uL (ref 4.0–10.5)

## 2020-01-26 LAB — LIPID PANEL
Cholesterol: 195 mg/dL (ref 0–200)
HDL: 30.5 mg/dL — ABNORMAL LOW (ref >39.00)
LDL Cholesterol: 133 mg/dL — ABNORMAL HIGH (ref 0–99)
NonHDL: 164.68
Total CHOL/HDL Ratio: 6
Triglycerides: 156 mg/dL — ABNORMAL HIGH (ref 0.0–149.0)
VLDL: 31.2 mg/dL (ref 0.0–40.0)

## 2020-01-26 LAB — HEMOGLOBIN A1C: Hgb A1c MFr Bld: 5.2 % (ref 4.6–6.5)

## 2020-01-26 LAB — PSA: PSA: 4.79 ng/mL — ABNORMAL HIGH (ref 0.10–4.00)

## 2020-01-26 LAB — TSH: TSH: 1.5 u[IU]/mL (ref 0.35–4.50)

## 2020-01-27 ENCOUNTER — Encounter: Payer: Self-pay | Admitting: Family Medicine

## 2020-01-27 ENCOUNTER — Ambulatory Visit (INDEPENDENT_AMBULATORY_CARE_PROVIDER_SITE_OTHER): Payer: Medicare Other | Admitting: Family Medicine

## 2020-01-27 ENCOUNTER — Telehealth: Payer: Self-pay

## 2020-01-27 VITALS — BP 135/82 | HR 73 | Temp 97.0°F | Ht 67.0 in | Wt 181.5 lb

## 2020-01-27 DIAGNOSIS — Z0000 Encounter for general adult medical examination without abnormal findings: Secondary | ICD-10-CM | POA: Diagnosis not present

## 2020-01-27 DIAGNOSIS — I10 Essential (primary) hypertension: Secondary | ICD-10-CM

## 2020-01-27 DIAGNOSIS — E039 Hypothyroidism, unspecified: Secondary | ICD-10-CM

## 2020-01-27 DIAGNOSIS — R972 Elevated prostate specific antigen [PSA]: Secondary | ICD-10-CM | POA: Diagnosis not present

## 2020-01-27 DIAGNOSIS — R7309 Other abnormal glucose: Secondary | ICD-10-CM

## 2020-01-27 DIAGNOSIS — E7800 Pure hypercholesterolemia, unspecified: Secondary | ICD-10-CM

## 2020-01-27 MED ORDER — LEVOTHYROXINE SODIUM 112 MCG PO TABS: 112.0000 ug | ORAL_TABLET | Freq: Every day | ORAL | 3 refills | Status: DC

## 2020-01-27 MED ORDER — BENAZEPRIL-HYDROCHLOROTHIAZIDE 10-12.5 MG PO TABS: 1.0000 | ORAL_TABLET | Freq: Every day | ORAL | 3 refills | Status: DC

## 2020-01-27 NOTE — Assessment & Plan Note (Signed)
Reviewed health habits including diet and exercise and skin cancer prevention Reviewed appropriate screening tests for age  Also reviewed health mt list, fam hx and immunization status , as well as social and family history   See HPI Labs reviewed  Declines covid and flu vaccines  Had shingrix vaccine  Tetanus def for insurance Colonoscopy 3/18 with 5 y recall Disc diet for high cholesterol and rev family hx

## 2020-01-27 NOTE — Assessment & Plan Note (Signed)
Hypothyroidism  Pt has no clinical changes No change in energy level/ hair or skin/ edema and no tremor Lab Results  Component Value Date   TSH 1.50 01/26/2020    Plan to continue 112 mcg levothyroxine daily

## 2020-01-27 NOTE — Progress Notes (Signed)
Subjective:    Patient ID: Carlos Larson, male    DOB: 28-Nov-1955, 64 y.o.   MRN: [id number]  This visit occurred during the SARS-CoV-2 public health emergency.  Safety protocols were in place, including screening questions prior to the visit, additional usage of staff PPE, and extensive cleaning of exam room while observing appropriate contact time as indicated for disinfecting solutions.    HPI Here for health maintenance exam and to review chronic medical problems    Wt Readings from Last 3 Encounters:  01/27/20 181 lb 8 oz (82.3 kg)  06/05/19 180 lb (81.6 kg)  01/24/19 178 lb 7 oz (80.9 kg)   28.43 kg/m   Feeling good overall  Nothing new going on  Weight machine twice weekly  Declines covid vaccine  Declines flu vaccine   Td 7/11  Had shingrix   colonoscopy 3/18 with 5 y recall   Prostate health Lab Results  Component Value Date   PSA 4.79 (H) 01/26/2020   PSA 4.07 (H) 01/22/2019   PSA 3.60 07/24/2018   Was ref to urology last time for elevated psa (possible BPH) Recommended re check this month and then f/u in July  No urinary issues  No nocturia   HTN bp is stable today  No cp or palpitations or headaches or edema  No side effects to medicines  BP Readings from Last 3 Encounters:  01/27/20 135/82  06/05/19 124/86  01/24/19 110/82    Taking benazepril hct 10-12.5 (takes it in the evening) -took it last night  bp was good 2 weeks ago at home   Hypothyroidism  Pt has no clinical changes No change in energy level/ hair or skin/ edema and no tremor Lab Results  Component Value Date   TSH 1.50 01/26/2020    Taking 112 mcg levothy   H/o elevated glucose level  Lab Results  Component Value Date   HGBA1C 5.2 01/26/2020  is trying to cut back sugar some  Less exercise this month (helping some people) Walks 1 mi per day most of the time  Has a treadmill   Cholesterol Lab Results  Component Value Date   CHOL 195 01/26/2020   CHOL 206 (H)  01/22/2019   CHOL 199 01/17/2018   Lab Results  Component Value Date   HDL 30.50 (L) 01/26/2020   HDL 35.50 (L) 01/22/2019   HDL 35.90 (L) 01/17/2018   Lab Results  Component Value Date   LDLCALC 133 (H) 01/26/2020   LDLCALC 144 (H) 01/22/2019   LDLCALC 143 (H) 01/17/2018   Lab Results  Component Value Date   TRIG 156.0 (H) 01/26/2020   TRIG 130.0 01/22/2019   TRIG 101.0 01/17/2018   Lab Results  Component Value Date   CHOLHDL 6 01/26/2020   CHOLHDL 6 01/22/2019   CHOLHDL 6 01/17/2018   Lab Results  Component Value Date   LDLDIRECT 103.0 01/13/2016   LDLDIRECT 80.4 11/25/2012   LDLDIRECT 156.2 12/01/2011   Mother had CAD  MGF heart problems   Does not eat much fat   Patient Active Problem List   Diagnosis Date Noted  . Elevated random blood glucose level 01/17/2016  . PSA elevation 01/17/2016  . Hyperlipidemia 01/12/2014  . Hematuria 12/02/2012  . Routine general medical examination at a health care facility 12/26/2010  . Prostate cancer screening 12/26/2010  . Family history of colon cancer 12/26/2010  . Hypothyroidism 06/22/2009  . Essential hypertension 06/22/2009   Past Medical History:  Diagnosis Date  .  Cataract   . HTN (hypertension)   . Hyperlipidemia   . Hypothyroid   . Pollen allergies    mild   Past Surgical History:  Procedure Laterality Date  . COLONOSCOPY  01/12/2006, 02/10/2011   2007 - Normal (Dr. Michail Sermon) 2013 - mild diverticulosis Carlean Purl)  . TONSILLECTOMY  1971   and adnoids   Social History   Tobacco Use  . Smoking status: Former Smoker    Quit date: 02/07/1972    Years since quitting: 48.0  . Smokeless tobacco: Never Used  . Tobacco comment: Smoked for 4 years  Substance Use Topics  . Alcohol use: No    Alcohol/week: 0.0 standard drinks  . Drug use: No   Family History  Problem Relation Age of Onset  . Colon cancer Father 25  . Hypertension Father   . Heart disease Mother        valvular disease  . Hypertension  Mother   . Prostate cancer Maternal Grandfather   . Hypertension Paternal Grandfather   . Coronary artery disease Maternal Grandmother   . Diabetes Neg Hx    No Known Allergies Current Outpatient Medications on File Prior to Visit  Medication Sig Dispense Refill  . Cholecalciferol (VITAMIN D) 50 MCG (2000 UT) tablet Take 2,000 Units by mouth daily.    . fluticasone (FLONASE) 50 MCG/ACT nasal spray SPRAY 1 SPRAY INTO EACH NOSTRIL TWICE A DAY (Patient taking differently: Place 1 spray into both nostrils daily as needed.) 16 g 1  . Omega-3 Fatty Acids (FISH OIL) 1200 MG CPDR Take 1 capsule by mouth.     No current facility-administered medications on file prior to visit.    Review of Systems  Constitutional: Negative for activity change, appetite change, fatigue, fever and unexpected weight change.  HENT: Negative for congestion, rhinorrhea, sore throat and trouble swallowing.   Eyes: Negative for pain, redness, itching and visual disturbance.  Respiratory: Negative for cough, chest tightness, shortness of breath and wheezing.   Cardiovascular: Negative for chest pain and palpitations.  Gastrointestinal: Negative for abdominal pain, blood in stool, constipation, diarrhea and nausea.  Endocrine: Negative for cold intolerance, heat intolerance, polydipsia and polyuria.  Genitourinary: Negative for difficulty urinating, dysuria, frequency and urgency.  Musculoskeletal: Negative for arthralgias, joint swelling and myalgias.  Skin: Negative for pallor and rash.  Neurological: Negative for dizziness, tremors, weakness, numbness and headaches.  Hematological: Negative for adenopathy. Does not bruise/bleed easily.  Psychiatric/Behavioral: Negative for decreased concentration and dysphoric mood. The patient is not nervous/anxious.        Objective:   Physical Exam Constitutional:      General: He is not in acute distress.    Appearance: Normal appearance. He is well-developed and normal  weight. He is not ill-appearing or diaphoretic.  HENT:     Head: Normocephalic and atraumatic.     Right Ear: Tympanic membrane, ear canal and external ear normal.     Left Ear: Tympanic membrane, ear canal and external ear normal.     Nose: Nose normal. No congestion.     Mouth/Throat:     Mouth: Mucous membranes are moist.     Pharynx: Oropharynx is clear. No posterior oropharyngeal erythema.  Eyes:     General: No scleral icterus.       Right eye: No discharge.        Left eye: No discharge.     Conjunctiva/sclera: Conjunctivae normal.     Pupils: Pupils are equal, round, and reactive to light.  Neck:     Thyroid: No thyromegaly.     Vascular: No carotid bruit or JVD.  Cardiovascular:     Rate and Rhythm: Normal rate and regular rhythm.     Pulses: Normal pulses.     Heart sounds: Normal heart sounds. No gallop.   Pulmonary:     Effort: Pulmonary effort is normal. No respiratory distress.     Breath sounds: Normal breath sounds. No wheezing or rales.     Comments: Good air exch Chest:     Chest wall: No tenderness.  Abdominal:     General: Bowel sounds are normal. There is no distension or abdominal bruit.     Palpations: Abdomen is soft. There is no mass.     Tenderness: There is no abdominal tenderness.     Hernia: No hernia is present.  Musculoskeletal:        General: No tenderness.     Cervical back: Normal range of motion and neck supple. No rigidity. No muscular tenderness.     Right lower leg: No edema.     Left lower leg: No edema.  Lymphadenopathy:     Cervical: No cervical adenopathy.  Skin:    General: Skin is warm and dry.     Coloration: Skin is not pale.     Findings: No erythema or rash.     Comments: Solar lentigines diffusely   Neurological:     Mental Status: He is alert.     Cranial Nerves: No cranial nerve deficit.     Motor: No abnormal muscle tone.     Coordination: Coordination normal.     Gait: Gait normal.     Deep Tendon Reflexes:  Reflexes are normal and symmetric. Reflexes normal.  Psychiatric:        Mood and Affect: Mood normal.        Cognition and Memory: Cognition and memory normal.           Assessment & Plan:   Problem List Items Addressed This Visit      Cardiovascular and Mediastinum   Essential hypertension    bp in fair control at this time  BP Readings from Last 1 Encounters:  01/27/20 135/82   No changes needed Most recent labs reviewed  Disc lifstyle change with low sodium diet and exercise  Plan to continue benazepril 10-12.5 mg daily  Continue to monitor at home       Relevant Medications   benazepril-hydrochlorthiazide (LOTENSIN HCT) 10-12.5 MG tablet     Endocrine   Hypothyroidism    Hypothyroidism  Pt has no clinical changes No change in energy level/ hair or skin/ edema and no tremor Lab Results  Component Value Date   TSH 1.50 01/26/2020    Plan to continue 112 mcg levothyroxine daily       Relevant Medications   levothyroxine (SYNTHROID) 112 MCG tablet     Other   Routine general medical examination at a health care facility - Primary    Reviewed health habits including diet and exercise and skin cancer prevention Reviewed appropriate screening tests for age  Also reviewed health mt list, fam hx and immunization status , as well as social and family history   See HPI Labs reviewed  Declines covid and flu vaccines  Had shingrix vaccine  Tetanus def for insurance Colonoscopy 3/18 with 5 y recall Disc diet for high cholesterol and rev family hx       Hyperlipidemia    Disc goals  for lipids and reasons to control them Rev last labs with pt Rev low sat fat diet in detail LDL down to 133- commended  HDL basleline low -on fish oil/ encouraged exercise Low threshold for statin if no continued improvement CAD in mother  Diet is fairly good       Relevant Medications   benazepril-hydrochlorthiazide (LOTENSIN HCT) 10-12.5 MG tablet   Elevated random blood  glucose level    Lab Results  Component Value Date   HGBA1C 5.2 01/26/2020   disc imp of low glycemic diet and wt loss to prevent DM2       PSA elevation    Pt saw urology in July-plan to f/u yearly  Under obs  Lab Results  Component Value Date   PSA 4.79 (H) 01/26/2020   PSA 4.07 (H) 01/22/2019   PSA 3.60 07/24/2018

## 2020-01-27 NOTE — Assessment & Plan Note (Signed)
bp in fair control at this time  BP Readings from Last 1 Encounters:  01/27/20 135/82   No changes needed Most recent labs reviewed  Disc lifstyle change with low sodium diet and exercise  Plan to continue benazepril 10-12.5 mg daily  Continue to monitor at home

## 2020-01-27 NOTE — Assessment & Plan Note (Signed)
Lab Results  Component Value Date   HGBA1C 5.2 01/26/2020   disc imp of low glycemic diet and wt loss to prevent DM2

## 2020-01-27 NOTE — Telephone Encounter (Signed)
Attempted to contact patient regarding covid vaccines. Patient declined to give any information about his vaccines. Patient doesn't want to receive any more calls about his vaccines.

## 2020-01-27 NOTE — Patient Instructions (Addendum)
For cholesterol Avoid red meat/ fried foods/ egg yolks/ fatty breakfast meats/ butter, cheese and high fat dairy/ and shellfish   We may discuss statin medicine for cholesterol in the future  Keep working on diet   Keep an eye on blood pressure at home  Check when relaxed (not after exercise)   Keep active   You will be due for urology appt in July  Give them a call in February to schedule  Let us know if you need a referral    Take care of yourself

## 2020-01-27 NOTE — Assessment & Plan Note (Signed)
Disc goals for lipids and reasons to control them Rev last labs with pt Rev low sat fat diet in detail LDL down to 133- commended  HDL basleline low -on fish oil/ encouraged exercise Low threshold for statin if no continued improvement CAD in mother  Diet is fairly good

## 2020-01-27 NOTE — Assessment & Plan Note (Signed)
Pt saw urology in [place] to f/u yearly  Under obs  Lab Results  Component Value Date   PSA 4.79 (H) 01/26/2020   PSA 4.07 (H) 01/22/2019   PSA 3.60 07/24/2018

## 2020-03-08 DIAGNOSIS — L720 Epidermal cyst: Secondary | ICD-10-CM | POA: Diagnosis not present

## 2020-08-16 DIAGNOSIS — R972 Elevated prostate specific antigen [PSA]: Secondary | ICD-10-CM | POA: Diagnosis not present

## 2020-08-23 DIAGNOSIS — N400 Enlarged prostate without lower urinary tract symptoms: Secondary | ICD-10-CM | POA: Diagnosis not present

## 2020-08-23 DIAGNOSIS — R3121 Asymptomatic microscopic hematuria: Secondary | ICD-10-CM | POA: Diagnosis not present

## 2020-08-23 DIAGNOSIS — R972 Elevated prostate specific antigen [PSA]: Secondary | ICD-10-CM | POA: Diagnosis not present

## 2021-01-16 ENCOUNTER — Telehealth: Payer: Self-pay | Admitting: Family Medicine

## 2021-01-16 DIAGNOSIS — E039 Hypothyroidism, unspecified: Secondary | ICD-10-CM

## 2021-01-16 DIAGNOSIS — R7309 Other abnormal glucose: Secondary | ICD-10-CM

## 2021-01-16 DIAGNOSIS — R972 Elevated prostate specific antigen [PSA]: Secondary | ICD-10-CM

## 2021-01-16 DIAGNOSIS — Z125 Encounter for screening for malignant neoplasm of prostate: Secondary | ICD-10-CM

## 2021-01-16 DIAGNOSIS — I10 Essential (primary) hypertension: Secondary | ICD-10-CM

## 2021-01-16 DIAGNOSIS — R739 Hyperglycemia, unspecified: Secondary | ICD-10-CM

## 2021-01-16 DIAGNOSIS — E7800 Pure hypercholesterolemia, unspecified: Secondary | ICD-10-CM

## 2021-01-16 NOTE — Telephone Encounter (Signed)
-----   Message from Ellamae Sia sent at 01/04/2021  9:36 AM EST ----- Regarding: Lab orders for Tuesday, 12.13.22 Patient is scheduled for CPX labs, please order future labs, Thanks , Karna Christmas

## 2021-01-18 ENCOUNTER — Other Ambulatory Visit: Payer: Medicare Other

## 2021-01-20 ENCOUNTER — Other Ambulatory Visit: Payer: Self-pay

## 2021-01-20 ENCOUNTER — Other Ambulatory Visit (INDEPENDENT_AMBULATORY_CARE_PROVIDER_SITE_OTHER): Payer: Medicare Other

## 2021-01-20 DIAGNOSIS — R7309 Other abnormal glucose: Secondary | ICD-10-CM | POA: Diagnosis not present

## 2021-01-20 DIAGNOSIS — E039 Hypothyroidism, unspecified: Secondary | ICD-10-CM | POA: Diagnosis not present

## 2021-01-20 DIAGNOSIS — E7800 Pure hypercholesterolemia, unspecified: Secondary | ICD-10-CM | POA: Diagnosis not present

## 2021-01-20 DIAGNOSIS — I10 Essential (primary) hypertension: Secondary | ICD-10-CM | POA: Diagnosis not present

## 2021-01-20 DIAGNOSIS — Z125 Encounter for screening for malignant neoplasm of prostate: Secondary | ICD-10-CM

## 2021-01-20 DIAGNOSIS — R972 Elevated prostate specific antigen [PSA]: Secondary | ICD-10-CM

## 2021-01-21 LAB — LIPID PANEL
Cholesterol: 185 mg/dL (ref 0–200)
HDL: 33.3 mg/dL — ABNORMAL LOW (ref >39.00)
LDL Cholesterol: 127 mg/dL — ABNORMAL HIGH (ref 0–99)
NonHDL: 152.1
Total CHOL/HDL Ratio: 6
Triglycerides: 128 mg/dL (ref 0.0–149.0)
VLDL: 25.6 mg/dL (ref 0.0–40.0)

## 2021-01-21 LAB — CBC WITH DIFFERENTIAL/PLATELET
Basophils Absolute: 0 10*3/uL (ref 0.0–0.1)
Basophils Relative: 0.3 % (ref 0.0–3.0)
Eosinophils Absolute: 0.2 10*3/uL (ref 0.0–0.7)
Eosinophils Relative: 2.2 % (ref 0.0–5.0)
HCT: 46.5 % (ref 39.0–52.0)
Hemoglobin: 16 g/dL (ref 13.0–17.0)
Lymphocytes Relative: 21 % (ref 12.0–46.0)
Lymphs Abs: 1.5 10*3/uL (ref 0.7–4.0)
MCHC: 34.4 g/dL (ref 30.0–36.0)
MCV: 93.1 fl (ref 78.0–100.0)
Monocytes Absolute: 0.4 10*3/uL (ref 0.1–1.0)
Monocytes Relative: 5.5 % (ref 3.0–12.0)
Neutro Abs: 5.1 10*3/uL (ref 1.4–7.7)
Neutrophils Relative %: 71 % (ref 43.0–77.0)
Platelets: 204 10*3/uL (ref 150.0–400.0)
RBC: 5 Mil/uL (ref 4.22–5.81)
RDW: 13.2 % (ref 11.5–15.5)
WBC: 7.2 10*3/uL (ref 4.0–10.5)

## 2021-01-21 LAB — COMPREHENSIVE METABOLIC PANEL
ALT: 17 U/L (ref 0–53)
AST: 17 U/L (ref 0–37)
Albumin: 4.5 g/dL (ref 3.5–5.2)
Alkaline Phosphatase: 72 U/L (ref 39–117)
BUN: 15 mg/dL (ref 6–23)
CO2: 31 mEq/L (ref 19–32)
Calcium: 9.7 mg/dL (ref 8.4–10.5)
Chloride: 103 mEq/L (ref 96–112)
Creatinine, Ser: 1.02 mg/dL (ref 0.40–1.50)
GFR: 76.88 mL/min (ref >60.00)
Glucose, Bld: 88 mg/dL (ref 70–99)
Potassium: 4.6 mEq/L (ref 3.5–5.1)
Sodium: 141 mEq/L (ref 135–145)
Total Bilirubin: 1.1 mg/dL (ref 0.2–1.2)
Total Protein: 6.7 g/dL (ref 6.0–8.3)

## 2021-01-21 LAB — HEMOGLOBIN A1C: Hgb A1c MFr Bld: 5.3 % (ref 4.6–6.5)

## 2021-01-21 LAB — PSA, MEDICARE: PSA: 4.87 ng/ml — ABNORMAL HIGH (ref 0.10–4.00)

## 2021-01-21 LAB — TSH: TSH: 0.03 u[IU]/mL — ABNORMAL LOW (ref 0.35–5.50)

## 2021-01-27 ENCOUNTER — Encounter: Payer: Self-pay | Admitting: Family Medicine

## 2021-01-27 ENCOUNTER — Other Ambulatory Visit: Payer: Self-pay

## 2021-01-27 ENCOUNTER — Ambulatory Visit (INDEPENDENT_AMBULATORY_CARE_PROVIDER_SITE_OTHER): Payer: Medicare Other | Admitting: Family Medicine

## 2021-01-27 VITALS — BP 126/78 | HR 72 | Temp 98.0°F | Ht 67.0 in | Wt 173.4 lb

## 2021-01-27 DIAGNOSIS — R7309 Other abnormal glucose: Secondary | ICD-10-CM

## 2021-01-27 DIAGNOSIS — E039 Hypothyroidism, unspecified: Secondary | ICD-10-CM | POA: Diagnosis not present

## 2021-01-27 DIAGNOSIS — Z800 Family history of malignant neoplasm of digestive organs: Secondary | ICD-10-CM

## 2021-01-27 DIAGNOSIS — Z0000 Encounter for general adult medical examination without abnormal findings: Secondary | ICD-10-CM | POA: Insufficient documentation

## 2021-01-27 DIAGNOSIS — I10 Essential (primary) hypertension: Secondary | ICD-10-CM | POA: Diagnosis not present

## 2021-01-27 DIAGNOSIS — E7800 Pure hypercholesterolemia, unspecified: Secondary | ICD-10-CM

## 2021-01-27 DIAGNOSIS — R972 Elevated prostate specific antigen [PSA]: Secondary | ICD-10-CM | POA: Diagnosis not present

## 2021-01-27 MED ORDER — LEVOTHYROXINE SODIUM 100 MCG PO TABS: 100.0000 ug | ORAL_TABLET | Freq: Every day | ORAL | 3 refills | Status: DC

## 2021-01-27 MED ORDER — BENAZEPRIL-HYDROCHLOROTHIAZIDE 10-12.5 MG PO TABS: 1.0000 | ORAL_TABLET | Freq: Every day | ORAL | 3 refills | Status: DC

## 2021-01-27 NOTE — Assessment & Plan Note (Signed)
Reviewed health habits including diet and exercise and skin cancer prevention Reviewed appropriate screening tests for age  Also reviewed health mt list, fam hx and immunization status , as well as social and family history   See HPI Labs reviewed  Declines flu and pna vaccine  Tetanus posponed for insurance  No falls (fall prev discussed noting balance is not as good) and enc to continue vit D 2000 iu daily  Good exercise  psa reviewed, plans urology f/u  Advance directive is utd  No cognitive concerns Hearing and vision screen reviewed (left glasses in car so vision abn noted)  utd eye/vision exam yearly (does in January)  phq 0 No functionality change, needs no help with ADLS

## 2021-01-27 NOTE — Assessment & Plan Note (Signed)
Colonoscopy was 04/2016  Will be due in march for recall  He will call to schedule if needed /watching for reminder

## 2021-01-27 NOTE — Assessment & Plan Note (Signed)
TSH is up Lab Results  Component Value Date   TSH 0.03 (L) 01/20/2021    Has been taking medicine appropriately  No clinical changes except for trouble sleeping (noted wt loss and this was intentional)  Levothyroxine dose lowered to 100 mcg daily  Re check TSH in 6 wk

## 2021-01-27 NOTE — Assessment & Plan Note (Signed)
Lab Results  Component Value Date   HGBA1C 5.3 01/20/2021   disc imp of low glycemic diet and wt loss to prevent DM2

## 2021-01-27 NOTE — Assessment & Plan Note (Signed)
Disc goals for lipids and reasons to control them Rev last labs with pt Rev low sat fat diet in detail HDL is low at 33.3 -not improved with fish oil/exercise LDL of 127  Would likely benefit from statin but pt declines it  Reviewed ASCVD risk score of 17.1% Will watch diet and continue to obs

## 2021-01-27 NOTE — Patient Instructions (Addendum)
I need to change your levothyroid dose down to 100 mcg daily  We will re check this in 4-6 weeks   You are a candidate for cholesterol medicine - if you want to try this in the future let us know   Take care of yourself  Eat a balanced diet  Keep walking  Wear sun protection

## 2021-01-27 NOTE — Progress Notes (Signed)
Subjective:    Patient ID: Carlos Larson, male    DOB: 1955-09-08, 65 y.o.   MRN: [id number]  This visit occurred during the SARS-CoV-2 public health emergency.  Safety protocols were in place, including screening questions prior to the visit, additional usage of staff PPE, and extensive cleaning of exam room while observing appropriate contact time as indicated for disinfecting solutions.   HPI Pt presents for welcome to medicare visit   I have personally reviewed the Medicare Annual Wellness questionnaire and have noted 1. The patient's medical and social history 2. Their use of alcohol, tobacco or illicit drugs 3. Their current medications and supplements 4. The patient's functional ability including ADL's, fall risks, home safety risks and hearing or visual             impairment. 5. Diet and physical activities 6. Evidence for depression or mood disorders  The patients weight, height, BMI have been recorded in the chart and visual acuity is per eye clinic.  I have made referrals, counseling and provided education to the patient based review of the above and I have provided the pt with a written personalized care plan for preventive services. Reviewed and updated provider list, see scanned forms.  See scanned forms.  Routine anticipatory guidance given to patient.  See health maintenance. Colon cancer screening  colonoscopy 04/2016 with 3 y recall  (father had colon cancer at 39 yo)  Flu vaccine declines  Tetanus vaccine Td 2011   postponed for ins  Pneumovax-declines for now  Zoster vaccine-had shingrix   Falls - none    equilibrium  Fractures- none  Supplements- vit D 2000 iu daily  Exercise - walking regularly  and gets out with dogs   Prostate cancer screening Lab Results  Component Value Date   PSA 4.87 (H) 01/20/2021   PSA 4.79 (H) 01/26/2020   PSA 4.07 (H) 01/22/2019  Has seen urology for psa elevation  Due for an appt this summer  No change in symptom    Advance directive: has a living will and power of attorney  Cognitive function addressed- see scanned forms- and if abnormal then additional documentation follows.   Memory is good  No change in cognition  Very brain active /does his own finances  Reads quite a bit  Has grandkids - socializes a lot    PMH and SH reviewed  Meds, vitals, and allergies reviewed.   ROS: See HPI.  Otherwise negative.    Weight : Wt Readings from Last 3 Encounters:  01/27/21 173 lb 6 oz (78.6 kg)  01/27/20 181 lb 8 oz (82.3 kg)  06/05/19 180 lb (81.6 kg)   27.15 kg/m  Wt is down- intentional  Eating smaller portions  Stays active  Trying to eat less sugar /good effort  Avoids fried foods   Hearing/vision: Hearing Screening   500Hz  1000Hz  2000Hz  4000Hz   Right ear 40 40 40 40  Left ear 40 40 40 40   Vision Screening   Right eye Left eye Both eyes  Without correction 20/70 20/70 20/70   With correction     Not wearing his glasses in the office, has glasses in the car  Sees the eye doctor once a year    PHQ: Depression screen [facility] 2/9 01/27/2021 01/27/2020 01/24/2019 01/21/2018 01/17/2017  Decreased Interest 0 0 0 0 0  Down, Depressed, Hopeless 0 0 0 0 0  PHQ - 2 Score 0 0 0 0 0  Altered sleeping - 0 - - -  Tired, decreased energy - 0 - - -  Change in appetite - 0 - - -  Feeling bad or failure about yourself  - 0 - - -  Trouble concentrating - 0 - - -  Moving slowly or fidgety/restless - 0 - - -  Suicidal thoughts - 0 - - -  PHQ-9 Score - 0 - - -  Difficult doing work/chores - Not difficult at all - - -    ADLs: no help needed   Functionality: excellent   Care team  Arnulfo Batson -[facility] - Martin Majestic Urologist - [place]   HTN bp is stable today  No cp or palpitations or headaches or edema  No side effects to medicines  BP Readings from Last 3 Encounters:  01/27/21 126/78  01/27/20 135/82  06/05/19 124/86    Taking benazepril 10-12.5 mg daily   Pulse Readings  from Last 3 Encounters:  01/27/21 72  01/27/20 73  06/05/19 66    Hypothyroidism  Pt has no clinical changes No change in energy level/ hair or skin/ edema and no tremor Lab Results  Component Value Date   TSH 0.03 (L) 01/20/2021    Takes 112 mcg levothyroxine daily  This is down significantly   He takes levothyroxine in am and waits 45 min to eat  A little more trouble sleeping     Hyperlipidemia Lab Results  Component Value Date   CHOL 185 01/20/2021   CHOL 195 01/26/2020   CHOL 206 (H) 01/22/2019   Lab Results  Component Value Date   HDL 33.30 (L) 01/20/2021   HDL 30.50 (L) 01/26/2020   HDL 35.50 (L) 01/22/2019   Lab Results  Component Value Date   LDLCALC 127 (H) 01/20/2021   LDLCALC 133 (H) 01/26/2020   LDLCALC 144 (H) 01/22/2019   Lab Results  Component Value Date   TRIG 128.0 01/20/2021   TRIG 156.0 (H) 01/26/2020   TRIG 130.0 01/22/2019   Lab Results  Component Value Date   CHOLHDL 6 01/20/2021   CHOLHDL 6 01/26/2020   CHOLHDL 6 01/22/2019   Lab Results  Component Value Date   LDLDIRECT 103.0 01/13/2016   LDLDIRECT 80.4 11/25/2012   LDLDIRECT 156.2 12/01/2011   Does take fish oil  Mother had CAD The 10-year ASCVD risk score (Arnett DK, et al., 2019) is: 17.1%   Values used to calculate the score:     Age: 40 years     Sex: Male     Is Non-Hispanic African American: No     Diabetic: No     Tobacco smoker: No     Systolic Blood Pressure: 170 mmHg     Is BP treated: Yes     HDL Cholesterol: 33.3 mg/dL     Total Cholesterol: 185 mg/dL   Glucose Lab Results  Component Value Date   HGBA1C 5.3 01/20/2021   This is stable   Lab Results  Component Value Date   CREATININE 1.02 01/20/2021   BUN 15 01/20/2021   NA 141 01/20/2021   K 4.6 01/20/2021   CL 103 01/20/2021   CO2 31 01/20/2021    Lab Results  Component Value Date   ALT 17 01/20/2021   AST 17 01/20/2021   ALKPHOS 72 01/20/2021   BILITOT 1.1 01/20/2021    Lab Results   Component Value Date   WBC 7.2 01/20/2021   HGB 16.0 01/20/2021   HCT 46.5 01/20/2021   MCV 93.1 01/20/2021   PLT 204.0 01/20/2021  Patient Active Problem List   Diagnosis Date Noted   Medicare welcome exam 01/27/2021   Elevated random blood glucose level 01/17/2016   PSA elevation 01/17/2016   Hyperlipidemia 01/12/2014   Hematuria 12/02/2012   Routine general medical examination at a health care facility 12/26/2010   Prostate cancer screening 12/26/2010   Family history of colon cancer 12/26/2010   Hypothyroidism 06/22/2009   Essential hypertension 06/22/2009   Past Medical History:  Diagnosis Date   Cataract    HTN (hypertension)    Hyperlipidemia    Hypothyroid    Pollen allergies    mild   Past Surgical History:  Procedure Laterality Date   COLONOSCOPY  01/12/2006, 02/10/2011   2007 - Normal (Dr. Michail Sermon) 2013 - mild diverticulosis Carlean Purl)   TONSILLECTOMY  1971   and adnoids   Social History   Tobacco Use   Smoking status: Former    Types: Cigarettes    Quit date: 02/07/1972    Years since quitting: 49.0   Smokeless tobacco: Never   Tobacco comments:    Smoked for 4 years  Substance Use Topics   Alcohol use: No    Alcohol/week: 0.0 standard drinks   Drug use: No   Family History  Problem Relation Age of Onset   Colon cancer Father 75   Hypertension Father    Heart disease Mother        valvular disease   Hypertension Mother    Prostate cancer Maternal Grandfather    Hypertension Paternal Grandfather    Coronary artery disease Maternal Grandmother    Diabetes Neg Hx    No Known Allergies Current Outpatient Medications on File Prior to Visit  Medication Sig Dispense Refill   Cholecalciferol (VITAMIN D) 50 MCG (2000 UT) tablet Take 2,000 Units by mouth daily.     fluticasone (FLONASE) 50 MCG/ACT nasal spray SPRAY 1 SPRAY INTO EACH NOSTRIL TWICE A DAY (Patient taking differently: Place 1 spray into both nostrils daily as needed.) 16 g 1   Omega-3  Fatty Acids (FISH OIL) 1200 MG CPDR Take 1 capsule by mouth.     vitamin C (ASCORBIC ACID) 500 MG tablet Take 500 mg by mouth daily.     Zinc 30 MG TABS Take 1 tablet by mouth daily.     No current facility-administered medications on file prior to visit.    Review of Systems  Constitutional:  Negative for activity change, appetite change, fatigue, fever and unexpected weight change.  HENT:  Negative for congestion, rhinorrhea, sore throat and trouble swallowing.   Eyes:  Negative for pain, redness, itching and visual disturbance.  Respiratory:  Negative for cough, chest tightness, shortness of breath and wheezing.   Cardiovascular:  Negative for chest pain and palpitations.  Gastrointestinal:  Negative for abdominal pain, blood in stool, constipation, diarrhea and nausea.  Endocrine: Negative for cold intolerance, heat intolerance, polydipsia and polyuria.  Genitourinary:  Negative for difficulty urinating, dysuria, frequency and urgency.  Musculoskeletal:  Negative for arthralgias, joint swelling and myalgias.  Skin:  Negative for pallor and rash.  Neurological:  Negative for dizziness, tremors, weakness, numbness and headaches.       Balance is not as good    Hematological:  Negative for adenopathy. Does not bruise/bleed easily.  Psychiatric/Behavioral:  Negative for decreased concentration and dysphoric mood. The patient is not nervous/anxious.       Objective:   Physical Exam Constitutional:      General: He is not in acute distress.  Appearance: Normal appearance. He is well-developed and normal weight. He is not ill-appearing or diaphoretic.  HENT:     Head: Normocephalic and atraumatic.     Right Ear: Tympanic membrane, ear canal and external ear normal.     Left Ear: Tympanic membrane, ear canal and external ear normal.     Nose: Nose normal. No congestion.     Mouth/Throat:     Mouth: Mucous membranes are moist.     Pharynx: Oropharynx is clear. No posterior  oropharyngeal erythema.  Eyes:     General: No scleral icterus.       Right eye: No discharge.        Left eye: No discharge.     Conjunctiva/sclera: Conjunctivae normal.     Pupils: Pupils are equal, round, and reactive to light.  Neck:     Thyroid: No thyromegaly.     Vascular: No carotid bruit or JVD.  Cardiovascular:     Rate and Rhythm: Normal rate and regular rhythm.     Pulses: Normal pulses.     Heart sounds: Normal heart sounds.    No gallop.  Pulmonary:     Effort: Pulmonary effort is normal. No respiratory distress.     Breath sounds: Normal breath sounds. No wheezing or rales.     Comments: Good air exch Chest:     Chest wall: No tenderness.  Abdominal:     General: Bowel sounds are normal. There is no distension or abdominal bruit.     Palpations: Abdomen is soft. There is no mass.     Tenderness: There is no abdominal tenderness.     Hernia: No hernia is present.  Musculoskeletal:        General: No tenderness.     Cervical back: Normal range of motion and neck supple. No rigidity. No muscular tenderness.     Right lower leg: No edema.     Left lower leg: No edema.     Comments: No kyphosis   Lymphadenopathy:     Cervical: No cervical adenopathy.  Skin:    General: Skin is warm and dry.     Coloration: Skin is not pale.     Findings: No erythema or rash.     Comments: Solar lentigines diffusely Few sks Recent cyst removal R neck is healed   Neurological:     Mental Status: He is alert.     Cranial Nerves: No cranial nerve deficit.     Motor: No abnormal muscle tone.     Coordination: Coordination normal.     Gait: Gait normal.     Deep Tendon Reflexes: Reflexes are normal and symmetric. Reflexes normal.  Psychiatric:        Mood and Affect: Mood normal.        Cognition and Memory: Cognition and memory normal.     Comments: Pleasant           Assessment & Plan:   Problem List Items Addressed This Visit       Cardiovascular and Mediastinum    Essential hypertension    bp in fair control at this time  BP Readings from Last 1 Encounters:  01/27/21 126/78  No changes needed Most recent labs reviewed  Disc lifstyle change with low sodium diet and exercise  Plan to continue benazepril hct 10-12.5 mg daily       Relevant Medications   benazepril-hydrochlorthiazide (LOTENSIN HCT) 10-12.5 MG tablet     Endocrine   Hypothyroidism  TSH is up Lab Results  Component Value Date   TSH 0.03 (L) 01/20/2021    Has been taking medicine appropriately  No clinical changes except for trouble sleeping (noted wt loss and this was intentional)  Levothyroxine dose lowered to 100 mcg daily  Re check TSH in 6 wk       Relevant Medications   levothyroxine (SYNTHROID) 100 MCG tablet     Other   Family history of colon cancer    Colonoscopy was 04/2016  Will be due in march for recall  He will call to schedule if needed /watching for reminder       Hyperlipidemia    Disc goals for lipids and reasons to control them Rev last labs with pt Rev low sat fat diet in detail HDL is low at 33.3 -not improved with fish oil/exercise LDL of 127  Would likely benefit from statin but pt declines it  Reviewed ASCVD risk score of 17.1% Will watch diet and continue to obs      Relevant Medications   benazepril-hydrochlorthiazide (LOTENSIN HCT) 10-12.5 MG tablet   Elevated random blood glucose level    Lab Results  Component Value Date   HGBA1C 5.3 01/20/2021  disc imp of low glycemic diet and wt loss to prevent DM2       PSA elevation    Lab Results  Component Value Date   PSA 4.87 (H) 01/20/2021   PSA 4.79 (H) 01/26/2020   PSA 4.07 (H) 01/22/2019   Fairly stable  No clinical changes Pt plans to f/u with urology as planned      Medicare welcome exam - Primary    Reviewed health habits including diet and exercise and skin cancer prevention Reviewed appropriate screening tests for age  Also reviewed health mt list, fam hx and  immunization status , as well as social and family history   See HPI Labs reviewed  Declines flu and pna vaccine  Tetanus posponed for insurance  No falls (fall prev discussed noting balance is not as good) and enc to continue vit D 2000 iu daily  Good exercise  psa reviewed, plans urology f/u  Advance directive is utd  No cognitive concerns Hearing and vision screen reviewed (left glasses in car so vision abn noted)  utd eye/vision exam yearly (does in January)  phq 0 No functionality change, needs no help with ADLS

## 2021-01-27 NOTE — Assessment & Plan Note (Signed)
Lab Results  Component Value Date   PSA 4.87 (H) 01/20/2021   PSA 4.79 (H) 01/26/2020   PSA 4.07 (H) 01/22/2019    Fairly stable  No clinical changes Pt plans to f/u with urology as planned

## 2021-01-27 NOTE — Assessment & Plan Note (Signed)
bp in fair control at this time  BP Readings from Last 1 Encounters:  01/27/21 126/78   No changes needed Most recent labs reviewed  Disc lifstyle change with low sodium diet and exercise  Plan to continue benazepril hct 10-12.5 mg daily

## 2021-03-11 ENCOUNTER — Telehealth: Payer: Self-pay | Admitting: Family Medicine

## 2021-03-11 DIAGNOSIS — E7800 Pure hypercholesterolemia, unspecified: Secondary | ICD-10-CM

## 2021-03-11 DIAGNOSIS — Z0000 Encounter for general adult medical examination without abnormal findings: Secondary | ICD-10-CM

## 2021-03-11 DIAGNOSIS — Z125 Encounter for screening for malignant neoplasm of prostate: Secondary | ICD-10-CM

## 2021-03-11 DIAGNOSIS — R7309 Other abnormal glucose: Secondary | ICD-10-CM

## 2021-03-11 DIAGNOSIS — I10 Essential (primary) hypertension: Secondary | ICD-10-CM

## 2021-03-11 DIAGNOSIS — E039 Hypothyroidism, unspecified: Secondary | ICD-10-CM

## 2021-03-11 NOTE — Telephone Encounter (Signed)
Pt has a lab appt  03/14/2021, there are no orders in.

## 2021-03-13 NOTE — Addendum Note (Signed)
Addended by: Loura Pardon A on: 03/13/2021 02:48 PM   Modules accepted: Orders

## 2021-03-14 ENCOUNTER — Other Ambulatory Visit: Payer: Self-pay

## 2021-03-14 ENCOUNTER — Other Ambulatory Visit (INDEPENDENT_AMBULATORY_CARE_PROVIDER_SITE_OTHER): Payer: Medicare Other

## 2021-03-14 ENCOUNTER — Other Ambulatory Visit: Payer: Medicare Other

## 2021-03-14 DIAGNOSIS — E039 Hypothyroidism, unspecified: Secondary | ICD-10-CM | POA: Diagnosis not present

## 2021-03-14 DIAGNOSIS — R7309 Other abnormal glucose: Secondary | ICD-10-CM

## 2021-03-14 DIAGNOSIS — Z125 Encounter for screening for malignant neoplasm of prostate: Secondary | ICD-10-CM | POA: Diagnosis not present

## 2021-03-14 DIAGNOSIS — E7800 Pure hypercholesterolemia, unspecified: Secondary | ICD-10-CM

## 2021-03-14 DIAGNOSIS — I10 Essential (primary) hypertension: Secondary | ICD-10-CM | POA: Diagnosis not present

## 2021-03-14 LAB — TSH: TSH: 0.2 u[IU]/mL — ABNORMAL LOW (ref 0.35–5.50)

## 2021-03-14 NOTE — Addendum Note (Signed)
Addended by: Leeanne Rio on: 03/14/2021 10:43 AM   Modules accepted: Orders

## 2021-03-15 ENCOUNTER — Telehealth: Payer: Self-pay | Admitting: *Deleted

## 2021-03-15 MED ORDER — LEVOTHYROXINE SODIUM 88 MCG PO TABS: 88.0000 ug | ORAL_TABLET | Freq: Every day | ORAL | 3 refills | Status: DC

## 2021-03-15 NOTE — Telephone Encounter (Signed)
Pt notified of lab results and Dr. Marliss Coots comments. Rx sent to pharmacy and f/u lab appt scheduled

## 2021-03-15 NOTE — Telephone Encounter (Signed)
-----   Message from Abner Greenspan, MD sent at 03/14/2021  9:12 PM EST ----- Tsh is improved but not quite at goal range Currently taking levothyroxine 100 mcg daily  I want to reduce it to 88 mcg daily  Please send in 30 with 3 ref  Re check tsh in about 6 weeks   Alert me if any clinical changes

## 2021-04-28 ENCOUNTER — Telehealth: Payer: Self-pay | Admitting: Family Medicine

## 2021-04-28 DIAGNOSIS — E039 Hypothyroidism, unspecified: Secondary | ICD-10-CM

## 2021-04-28 NOTE — Telephone Encounter (Signed)
-----   Message from Ellamae Sia sent at 04/25/2021  4:04 PM EDT ----- ?Regarding: Lab orders for Friday, 04/29/21 ?Lab orders please. ? ?

## 2021-04-29 ENCOUNTER — Other Ambulatory Visit: Payer: Self-pay

## 2021-04-29 ENCOUNTER — Other Ambulatory Visit (INDEPENDENT_AMBULATORY_CARE_PROVIDER_SITE_OTHER): Payer: Medicare Other

## 2021-04-29 DIAGNOSIS — E039 Hypothyroidism, unspecified: Secondary | ICD-10-CM | POA: Diagnosis not present

## 2021-05-03 LAB — TSH: TSH: 0.54 u[IU]/mL (ref 0.35–5.50)

## 2021-05-04 ENCOUNTER — Encounter: Payer: Self-pay | Admitting: *Deleted

## 2021-05-04 ENCOUNTER — Telehealth: Payer: Self-pay | Admitting: *Deleted

## 2021-05-04 MED ORDER — LEVOTHYROXINE SODIUM 88 MCG PO TABS: 88.0000 ug | ORAL_TABLET | Freq: Every day | ORAL | 2 refills | Status: DC

## 2021-05-04 NOTE — Telephone Encounter (Signed)
-----   Message from Abner Greenspan, MD sent at 05/03/2021  7:20 PM EDT ----- ?Continue the levothyroxine at 88 mcg  ?Refill for year if needed  ?

## 2021-06-27 ENCOUNTER — Encounter: Payer: Self-pay | Admitting: Internal Medicine

## 2021-07-27 ENCOUNTER — Encounter: Payer: Self-pay | Admitting: Internal Medicine

## 2021-08-19 DIAGNOSIS — R972 Elevated prostate specific antigen [PSA]: Secondary | ICD-10-CM | POA: Diagnosis not present

## 2021-08-24 ENCOUNTER — Ambulatory Visit (AMBULATORY_SURGERY_CENTER): Payer: Medicare Other | Admitting: *Deleted

## 2021-08-24 VITALS — Ht 67.0 in | Wt 178.2 lb

## 2021-08-24 DIAGNOSIS — R3129 Other microscopic hematuria: Secondary | ICD-10-CM | POA: Diagnosis not present

## 2021-08-24 DIAGNOSIS — N400 Enlarged prostate without lower urinary tract symptoms: Secondary | ICD-10-CM | POA: Diagnosis not present

## 2021-08-24 DIAGNOSIS — Z800 Family history of malignant neoplasm of digestive organs: Secondary | ICD-10-CM

## 2021-08-24 DIAGNOSIS — R972 Elevated prostate specific antigen [PSA]: Secondary | ICD-10-CM | POA: Diagnosis not present

## 2021-08-24 NOTE — Procedures (Signed)
No egg or soy allergy known to patient  No issues known to pt with past sedation with any surgeries or procedures Patient denies ever being told they had issues or difficulty with intubation  No FH of Malignant Hyperthermia Pt is not on diet pills Pt is not on  home 02  Pt is not on blood thinners  Pt denies issues with constipation  No A fib or A flutter Have any cardiac testing pending--no Pt instructed to use Singlecare.com or GoodRx for a price reduction on prep    Sample sheet of over the counter items to purchase for prep given to pt.

## 2021-09-22 ENCOUNTER — Encounter: Payer: Self-pay | Admitting: Internal Medicine

## 2021-09-22 ENCOUNTER — Ambulatory Visit (AMBULATORY_SURGERY_CENTER): Payer: Medicare Other | Admitting: Internal Medicine

## 2021-09-22 VITALS — BP 115/73 | HR 61 | Temp 97.3°F | Resp 9 | Ht 67.0 in | Wt 178.0 lb

## 2021-09-22 DIAGNOSIS — D122 Benign neoplasm of ascending colon: Secondary | ICD-10-CM

## 2021-09-22 DIAGNOSIS — Z800 Family history of malignant neoplasm of digestive organs: Secondary | ICD-10-CM

## 2021-09-22 DIAGNOSIS — K5140 Inflammatory polyps of colon without complications: Secondary | ICD-10-CM | POA: Diagnosis not present

## 2021-09-22 DIAGNOSIS — D120 Benign neoplasm of cecum: Secondary | ICD-10-CM

## 2021-09-22 DIAGNOSIS — Z1211 Encounter for screening for malignant neoplasm of colon: Secondary | ICD-10-CM

## 2021-09-22 MED ORDER — SODIUM CHLORIDE 0.9 % IV SOLN: 500.0000 mL | Freq: Once | INTRAVENOUS | Status: DC

## 2021-09-22 NOTE — Patient Instructions (Addendum)
I found 2 tiny polyps today - look benign. I will let you know pathology results and when to have another routine colonoscopy by mail and/or My Chart.  I appreciate the opportunity to care for you. Gatha Mayer, MD, FACG  YOU HAD AN ENDOSCOPIC PROCEDURE TODAY AT [facility]:   Refer to the procedure report that was given to you for any specific questions about what was found during the examination.  If the procedure report does not answer your questions, please call your gastroenterologist to clarify.  If you requested that your care partner not be given the details of your procedure findings, then the procedure report has been included in a sealed envelope for you to review at your convenience later.  YOU SHOULD EXPECT: Some feelings of bloating in the abdomen. Passage of more gas than usual.  Walking can help get rid of the air that was put into your GI tract during the procedure and reduce the bloating. If you had a lower endoscopy (such as a colonoscopy or flexible sigmoidoscopy) you may notice spotting of blood in your stool or on the toilet paper. If you underwent a bowel prep for your procedure, you may not have a normal bowel movement for a few days.  Please Note:  You might notice some irritation and congestion in your nose or some drainage.  This is from the oxygen used during your procedure.  There is no need for concern and it should clear up in a day or so.  SYMPTOMS TO REPORT IMMEDIATELY:  Following lower endoscopy (colonoscopy or flexible sigmoidoscopy):  Excessive amounts of blood in the stool  Significant tenderness or worsening of abdominal pains  Swelling of the abdomen that is new, acute  Fever of 100F or higher  For urgent or emergent issues, a gastroenterologist can be reached at any hour by calling [phone]. Do not use MyChart messaging for urgent concerns.    DIET:  We do recommend a small meal at first, but then you may proceed to your  regular diet.  Drink plenty of fluids but you should avoid alcoholic beverages for 24 hours.  ACTIVITY:  You should plan to take it easy for the rest of today and you should NOT DRIVE or use heavy machinery until tomorrow (because of the sedation medicines used during the test).    FOLLOW UP: Our staff will call the number listed on your records the next business day following your procedure.  We will call around 7:15- 8:00 am to check on you and address any questions or concerns that you may have regarding the information given to you following your procedure. If we do not reach you, we will leave a message.  If you develop any symptoms (ie: fever, flu-like symptoms, shortness of breath, cough etc.) before then, please call [phone].  If you test positive for Covid 19 in the 2 weeks post procedure, please call and report this information to [place].    If any biopsies were taken you will be contacted by phone or by letter within the next 1-3 weeks.  Please call us at [phone] if you have not heard about the biopsies in 3 weeks.    SIGNATURES/CONFIDENTIALITY: You and/or your care partner have signed paperwork which will be entered into your electronic medical record.  These signatures attest to the fact that that the information above on your After Visit Summary has been reviewed and is understood.  Full responsibility of the confidentiality of  this discharge information lies with you and/or your care-partner.

## 2021-09-22 NOTE — Op Note (Signed)
[facility] Patient Name: Carlos Larson Procedure Date: 09/22/2021 8:33 AM MRN: [id number] Endoscopist: Gatha Mayer , MD Age: 66 Referring MD:  Date of Birth: [birth year] Gender: Male Account #: [id number] Procedure:                Colonoscopy Indications:              Screening in patient at increased risk: Family                            history of 1st-degree relative with colorectal                            cancer Medicines:                Monitored Anesthesia Care Procedure:                Pre-Anesthesia Assessment:                           - Prior to the procedure, a History and Physical                            was performed, and patient medications and                            allergies were reviewed. The patient's tolerance of                            previous anesthesia was also reviewed. The risks                            and benefits of the procedure and the sedation                            options and risks were discussed with the patient.                            All questions were answered, and informed consent                            was obtained. Prior Anticoagulants: The patient has                            taken no previous anticoagulant or antiplatelet                            agents. ASA Grade Assessment: II - A patient with                            mild systemic disease. After reviewing the risks                            and benefits, the patient was deemed in  satisfactory condition to undergo the procedure.                           After obtaining informed consent, the colonoscope                            was passed under direct vision. Throughout the                            procedure, the patient's blood pressure, pulse, and                            oxygen saturations were monitored continuously. The                            CF [id number] #[id number] was introduced through the anus                             and advanced to the the cecum, identified by                            appendiceal orifice and ileocecal valve. The                            colonoscopy was performed without difficulty. The                            patient tolerated the procedure well. The quality                            of the bowel preparation was good. The ileocecal                            valve, appendiceal orifice, and rectum were                            photographed. The bowel preparation used was                            Miralax via split dose instruction. Scope In: 8:36:53 AM Scope Out: 8:53:25 AM Scope Withdrawal Time: 0 hours 14 minutes 44 seconds  Total Procedure Duration: 0 hours 16 minutes 32 seconds  Findings:                 The perianal and digital rectal examinations were                            normal. Pertinent negatives include normal prostate                            (size, shape, and consistency).                           Two sessile polyps were found in the ascending  colon and cecum. The polyps were diminutive in                            size. These polyps were removed with a cold snare.                            Resection and retrieval were complete. Verification                            of patient identification for the specimen was                            done. Estimated blood loss was minimal.                           Multiple diverticula were found in the sigmoid                            colon.                           The exam was otherwise without abnormality on                            direct and retroflexion views. Complications:            No immediate complications. Estimated Blood Loss:     Estimated blood loss was minimal. Impression:               - Two diminutive polyps in the ascending colon and                            in the cecum, removed with a cold snare. Resected                            and  retrieved.                           - Diverticulosis in the sigmoid colon.                           - The examination was otherwise normal on direct                            and retroflexion views. Father had CRCA at 41 Recommendation:           - Patient has a contact number available for                            emergencies. The signs and symptoms of potential                            delayed complications were discussed with the  patient. Return to normal activities tomorrow.                            Written discharge instructions were provided to the                            patient.                           - Resume previous diet.                           - Continue present medications.                           - Repeat colonoscopy is recommended. The                            colonoscopy date will be determined after pathology                            results from today's exam become available for                            review. Gatha Mayer, MD 09/22/2021 9:04:14 AM This report has been signed electronically.

## 2021-09-22 NOTE — Progress Notes (Signed)
To pacu, VSS. Report to [email or web address] 

## 2021-09-22 NOTE — Progress Notes (Signed)
[facility] History and Physical   Primary Care Physician:  Tower, Wynelle Fanny, MD   Reason for Procedure:   CRCA screen, father had colon cancer  Plan:    colonoscopy     HPI: Carlos Larson is a 66 y.o. male here for screening exam   Past Medical History:  Diagnosis Date   Cataract    HTN (hypertension)    Hyperlipidemia    border line no medication updated 08/24/21   Hypothyroid    Pollen allergies    mild    Past Surgical History:  Procedure Laterality Date   COLONOSCOPY  01/12/2006, 02/10/2011   2007 - Normal (Dr. Michail Sermon) 2013 - mild diverticulosis Carlean Purl)   TONSILLECTOMY  1971   and adnoids    Prior to Admission medications   Medication Sig Start Date End Date Taking? Authorizing Provider  benazepril-hydrochlorthiazide (LOTENSIN HCT) 10-12.5 MG tablet Take 1 tablet by mouth daily. 01/27/21  Yes Tower, Wynelle Fanny, MD  Cholecalciferol (VITAMIN D) 50 MCG (2000 UT) tablet Take 2,000 Units by mouth daily.   Yes [provider]  levothyroxine (SYNTHROID) 88 MCG tablet Take 1 tablet (88 mcg total) by mouth daily before breakfast. 05/04/21  Yes Tower, Wynelle Fanny, MD  Omega-3 Fatty Acids (FISH OIL) 1200 MG CPDR Take 1 capsule by mouth.   Yes [provider]  sildenafil (VIAGRA) 100 MG tablet Take 100 mg by mouth daily as needed for erectile dysfunction.   Yes [provider]  vitamin C (ASCORBIC ACID) 500 MG tablet Take 500 mg by mouth daily.   Yes [provider]  Zinc 30 MG TABS Take 1 tablet by mouth daily.   Yes [provider]  fluticasone (FLONASE) 50 MCG/ACT nasal spray SPRAY 1 SPRAY INTO EACH NOSTRIL TWICE A DAY Patient not taking: Reported on 08/24/2021 08/11/16   Pleas Koch, NP    Current Outpatient Medications  Medication Sig Dispense Refill   benazepril-hydrochlorthiazide (LOTENSIN HCT) 10-12.5 MG tablet Take 1 tablet by mouth daily. 90 tablet 3   Cholecalciferol (VITAMIN D) 50 MCG (2000 UT) tablet Take  2,000 Units by mouth daily.     levothyroxine (SYNTHROID) 88 MCG tablet Take 1 tablet (88 mcg total) by mouth daily before breakfast. 90 tablet 2   Omega-3 Fatty Acids (FISH OIL) 1200 MG CPDR Take 1 capsule by mouth.     sildenafil (VIAGRA) 100 MG tablet Take 100 mg by mouth daily as needed for erectile dysfunction.     vitamin C (ASCORBIC ACID) 500 MG tablet Take 500 mg by mouth daily.     Zinc 30 MG TABS Take 1 tablet by mouth daily.     fluticasone (FLONASE) 50 MCG/ACT nasal spray SPRAY 1 SPRAY INTO EACH NOSTRIL TWICE A DAY (Patient not taking: Reported on 08/24/2021) 16 g 1   Current Facility-Administered Medications  Medication Dose Route Frequency Provider Last Rate Last Admin   0.9 %  sodium chloride infusion  500 mL Intravenous Once Gatha Mayer, MD        Allergies as of 09/22/2021   (No Known Allergies)    Family History  Problem Relation Age of Onset   Heart disease Mother        valvular disease   Hypertension Mother    Colon polyps Father    Colon cancer Father 50   Hypertension Father    Heart disease Brother    Heart disease Brother    Heart disease Maternal Grandmother    Coronary  artery disease Maternal Grandmother    Prostate cancer Maternal Grandfather    Hypertension Paternal Grandfather    Diabetes Neg Hx    Crohn's disease Neg Hx    Esophageal cancer Neg Hx    Rectal cancer Neg Hx    Stomach cancer Neg Hx     Social History   Socioeconomic History   Marital status: Single    Spouse name: Not on file   Number of children: Not on file   Years of education: Not on file   Highest education level: Not on file  Occupational History   Occupation: Lineman    Employer: [employer]  Tobacco Use   Smoking status: Former    Types: Cigarettes    Quit date: 02/07/1972    Years since quitting: 49.6    Passive exposure: Never   Smokeless tobacco: Never   Tobacco comments:    Smoked for 4 years  Vaping Use   Vaping Use: Never used  Substance and Sexual  Activity   Alcohol use: No    Alcohol/week: 0.0 standard drinks of alcohol   Drug use: No   Sexual activity: Not on file  Other Topics Concern   Not on file  Social History Narrative   Married      Regular exercise (lifts weights)      Lineman for [employer]   Social Determinants of Health   Financial Resource Strain: Not on file  Food Insecurity: Not on file  Transportation Needs: Not on file  Physical Activity: Not on file  Stress: Not on file  Social Connections: Not on file  Intimate Partner Violence: Not on file    Review of Systems:  All other review of systems negative except as mentioned in the HPI.  Physical Exam: Vital signs BP 136/73   Pulse 65   Temp (!) 97.3 F (36.3 C)   Ht '5\' 7"'$  (1.702 m)   Wt 178 lb (80.7 kg)   SpO2 98%   BMI 27.88 kg/m   General:   Alert,  Well-developed, well-nourished, pleasant and cooperative in NAD Lungs:  Clear throughout to auscultation.   Heart:  Regular rate and rhythm; no murmurs, clicks, rubs,  or gallops. Abdomen:  Soft, nontender and nondistended. Normal bowel sounds.   Neuro/Psych:  Alert and cooperative. Normal mood and affect. A and O x 3   '@Dwain Huhn'$  Simonne Maffucci, MD, [facility] [phone] (pager) 09/22/2021 8:29 AM@

## 2021-09-22 NOTE — Progress Notes (Signed)
Pt's states no medical or surgical changes since previsit or office visit. 

## 2021-09-22 NOTE — Progress Notes (Signed)
Called to room to assist during endoscopic procedure.  Patient ID and intended procedure confirmed with present staff. Received instructions for my participation in the procedure from the performing physician.  

## 2021-09-23 ENCOUNTER — Telehealth: Payer: Self-pay

## 2021-09-23 NOTE — Telephone Encounter (Signed)
Unable to [facility] for follow-up call after pt. Procedure 09/22/2021.  Sounded like the phone hung up on the call before I could speak.

## 2021-09-29 ENCOUNTER — Encounter: Payer: Self-pay | Admitting: Internal Medicine

## 2021-11-28 DIAGNOSIS — H0288B Meibomian gland dysfunction left eye, upper and lower eyelids: Secondary | ICD-10-CM | POA: Diagnosis not present

## 2021-11-28 DIAGNOSIS — H2513 Age-related nuclear cataract, bilateral: Secondary | ICD-10-CM | POA: Diagnosis not present

## 2021-11-28 DIAGNOSIS — H25013 Cortical age-related cataract, bilateral: Secondary | ICD-10-CM | POA: Diagnosis not present

## 2021-11-28 DIAGNOSIS — H0288A Meibomian gland dysfunction right eye, upper and lower eyelids: Secondary | ICD-10-CM | POA: Diagnosis not present

## 2021-11-28 DIAGNOSIS — H5213 Myopia, bilateral: Secondary | ICD-10-CM | POA: Diagnosis not present

## 2022-01-22 ENCOUNTER — Other Ambulatory Visit: Payer: Self-pay | Admitting: Family Medicine

## 2022-01-22 ENCOUNTER — Telehealth: Payer: Self-pay | Admitting: Family Medicine

## 2022-01-22 DIAGNOSIS — R7309 Other abnormal glucose: Secondary | ICD-10-CM

## 2022-01-22 DIAGNOSIS — E039 Hypothyroidism, unspecified: Secondary | ICD-10-CM

## 2022-01-22 DIAGNOSIS — Z125 Encounter for screening for malignant neoplasm of prostate: Secondary | ICD-10-CM

## 2022-01-22 DIAGNOSIS — E7800 Pure hypercholesterolemia, unspecified: Secondary | ICD-10-CM

## 2022-01-22 DIAGNOSIS — R972 Elevated prostate specific antigen [PSA]: Secondary | ICD-10-CM

## 2022-01-22 DIAGNOSIS — I10 Essential (primary) hypertension: Secondary | ICD-10-CM

## 2022-01-22 NOTE — Telephone Encounter (Signed)
-----   Message from Velna Hatchet, RT sent at 01/09/2022  8:53 AM EST ----- Regarding: Mon 12/18 lab Patient is scheduled for cpx, please order future labs.  Thanks, Anda Kraft

## 2022-01-23 ENCOUNTER — Other Ambulatory Visit (INDEPENDENT_AMBULATORY_CARE_PROVIDER_SITE_OTHER): Payer: Medicare Other

## 2022-01-23 DIAGNOSIS — Z125 Encounter for screening for malignant neoplasm of prostate: Secondary | ICD-10-CM

## 2022-01-23 DIAGNOSIS — E7800 Pure hypercholesterolemia, unspecified: Secondary | ICD-10-CM | POA: Diagnosis not present

## 2022-01-23 DIAGNOSIS — R972 Elevated prostate specific antigen [PSA]: Secondary | ICD-10-CM

## 2022-01-23 DIAGNOSIS — I10 Essential (primary) hypertension: Secondary | ICD-10-CM

## 2022-01-23 DIAGNOSIS — R7309 Other abnormal glucose: Secondary | ICD-10-CM | POA: Diagnosis not present

## 2022-01-23 DIAGNOSIS — E039 Hypothyroidism, unspecified: Secondary | ICD-10-CM

## 2022-01-23 LAB — CBC WITH DIFFERENTIAL/PLATELET
Basophils Absolute: 0 10*3/uL (ref 0.0–0.1)
Basophils Relative: 0.8 % (ref 0.0–3.0)
Eosinophils Absolute: 0.2 10*3/uL (ref 0.0–0.7)
Eosinophils Relative: 2.8 % (ref 0.0–5.0)
HCT: 48.1 % (ref 39.0–52.0)
Hemoglobin: 16.5 g/dL (ref 13.0–17.0)
Lymphocytes Relative: 24.9 % (ref 12.0–46.0)
Lymphs Abs: 1.6 10*3/uL (ref 0.7–4.0)
MCHC: 34.4 g/dL (ref 30.0–36.0)
MCV: 93.2 fl (ref 78.0–100.0)
Monocytes Absolute: 0.3 10*3/uL (ref 0.1–1.0)
Monocytes Relative: 5.2 % (ref 3.0–12.0)
Neutro Abs: 4.3 10*3/uL (ref 1.4–7.7)
Neutrophils Relative %: 66.3 % (ref 43.0–77.0)
Platelets: 189 10*3/uL (ref 150.0–400.0)
RBC: 5.16 Mil/uL (ref 4.22–5.81)
RDW: 13.8 % (ref 11.5–15.5)
WBC: 6.4 10*3/uL (ref 4.0–10.5)

## 2022-01-23 LAB — COMPREHENSIVE METABOLIC PANEL
ALT: 21 U/L (ref 0–53)
AST: 21 U/L (ref 0–37)
Albumin: 4.6 g/dL (ref 3.5–5.2)
Alkaline Phosphatase: 63 U/L (ref 39–117)
BUN: 11 mg/dL (ref 6–23)
CO2: 31 mEq/L (ref 19–32)
Calcium: 9.7 mg/dL (ref 8.4–10.5)
Chloride: 104 mEq/L (ref 96–112)
Creatinine, Ser: 1.05 mg/dL (ref 0.40–1.50)
GFR: 73.73 mL/min (ref >60.00)
Glucose, Bld: 103 mg/dL — ABNORMAL HIGH (ref 70–99)
Potassium: 4.1 mEq/L (ref 3.5–5.1)
Sodium: 143 mEq/L (ref 135–145)
Total Bilirubin: 0.9 mg/dL (ref 0.2–1.2)
Total Protein: 6.8 g/dL (ref 6.0–8.3)

## 2022-01-23 LAB — TSH: TSH: 1.95 u[IU]/mL (ref 0.35–5.50)

## 2022-01-23 LAB — LIPID PANEL
Cholesterol: 206 mg/dL — ABNORMAL HIGH (ref 0–200)
HDL: 35.7 mg/dL — ABNORMAL LOW (ref >39.00)
LDL Cholesterol: 144 mg/dL — ABNORMAL HIGH (ref 0–99)
NonHDL: 170
Total CHOL/HDL Ratio: 6
Triglycerides: 132 mg/dL (ref 0.0–149.0)
VLDL: 26.4 mg/dL (ref 0.0–40.0)

## 2022-01-23 LAB — HEMOGLOBIN A1C: Hgb A1c MFr Bld: 5.3 % (ref 4.6–6.5)

## 2022-01-23 LAB — PSA, MEDICARE: PSA: 3.88 ng/ml (ref 0.10–4.00)

## 2022-01-31 ENCOUNTER — Encounter: Payer: Self-pay | Admitting: Family Medicine

## 2022-01-31 ENCOUNTER — Ambulatory Visit (INDEPENDENT_AMBULATORY_CARE_PROVIDER_SITE_OTHER): Payer: Medicare Other | Admitting: Family Medicine

## 2022-01-31 VITALS — BP 124/78 | HR 69 | Temp 98.1°F | Ht 66.5 in | Wt 178.4 lb

## 2022-01-31 DIAGNOSIS — E7800 Pure hypercholesterolemia, unspecified: Secondary | ICD-10-CM

## 2022-01-31 DIAGNOSIS — Z800 Family history of malignant neoplasm of digestive organs: Secondary | ICD-10-CM

## 2022-01-31 DIAGNOSIS — Z0000 Encounter for general adult medical examination without abnormal findings: Secondary | ICD-10-CM

## 2022-01-31 DIAGNOSIS — Z23 Encounter for immunization: Secondary | ICD-10-CM | POA: Diagnosis not present

## 2022-01-31 DIAGNOSIS — N400 Enlarged prostate without lower urinary tract symptoms: Secondary | ICD-10-CM

## 2022-01-31 DIAGNOSIS — I10 Essential (primary) hypertension: Secondary | ICD-10-CM | POA: Diagnosis not present

## 2022-01-31 DIAGNOSIS — Z125 Encounter for screening for malignant neoplasm of prostate: Secondary | ICD-10-CM

## 2022-01-31 DIAGNOSIS — R739 Hyperglycemia, unspecified: Secondary | ICD-10-CM

## 2022-01-31 DIAGNOSIS — M79671 Pain in right foot: Secondary | ICD-10-CM

## 2022-01-31 DIAGNOSIS — E039 Hypothyroidism, unspecified: Secondary | ICD-10-CM

## 2022-01-31 DIAGNOSIS — R7309 Other abnormal glucose: Secondary | ICD-10-CM

## 2022-01-31 MED ORDER — LEVOTHYROXINE SODIUM 88 MCG PO TABS: 88.0000 ug | ORAL_TABLET | Freq: Every day | ORAL | 3 refills | Status: DC

## 2022-01-31 NOTE — Assessment & Plan Note (Signed)
Reviewed health habits including diet and exercise and skin cancer prevention Reviewed appropriate screening tests for age  Also reviewed health mt list, fam hx and immunization status , as well as social and family history   See HPI Labs reviewed  Declines flu shot Prevnar 20 vaccine given  Due for Tetanus- enc him to get at pharmacy if affordable Colonoscopy 09/2021 with 5 y recall for fam hx Psa is improved, sees urology yearly Great exercise habits, commended

## 2022-01-31 NOTE — Assessment & Plan Note (Signed)
Lab Results  Component Value Date   HGBA1C 5.3 01/23/2022   disc imp of low glycemic diet and wt loss to prevent DM2

## 2022-01-31 NOTE — Assessment & Plan Note (Signed)
Colonoscopy 09/2021 with 5 y recall

## 2022-01-31 NOTE — Progress Notes (Signed)
Subjective:    Patient ID: Carlos Larson, male    DOB: 11-27-1955, 66 y.o.   MRN: [id number]  HPI Here for health maintenance exam and to review chronic medical problems    Wt Readings from Last 3 Encounters:  01/31/22 178 lb 6 oz (80.9 kg)  09/22/21 178 lb (80.7 kg)  08/24/21 178 lb 3.2 oz (80.8 kg)   28.36 kg/m  Feeling good overall   Is having trouble with R ankle / then foot (ball)  Does not remember spraining it recently  (perhaps a year ago)  Old injury- foot was crushed years ago  Last pod visit- was several years ago  No swelling   Walks 6 days per week  Walks about 25 minute  Has a weight machine that he uses also     Immunization History  Administered Date(s) Administered   Influenza Split 10/22/2010   Influenza,inj,Quad PF,6+ Mos 11/14/2014, 11/19/2017, 10/18/2018   Influenza-Unspecified 11/16/2013, 11/06/2015, 12/05/2016, 11/20/2017   Td 12/11/2001, 08/16/2009   Zoster Recombinat (Shingrix) 01/24/2019, 04/08/2019   Health Maintenance Due  Topic Date Due   Hepatitis C Screening  Never done   DTaP/Tdap/Td (3 - Tdap) 08/17/2019   Pneumonia Vaccine 70+ Years old (1 - PCV) Never done   INFLUENZA VACCINE  09/06/2021   Medicare Annual Wellness (AWV)  01/27/2022   Td 2011  Pna vaccine : today   Flu shot : declines   Vit C , D3 , zinc   Colonoscopy 09/2021 with 5 y recall  Prostate health Lab Results  Component Value Date   PSA 3.88 01/23/2022   PSA 4.87 (H) 01/20/2021   PSA 4.79 (H) 01/26/2020   Last saw urology in July  Noted BPH No change in nocturia  Stream takes longer   Takes sildenafil 50 mg   HTN bp is stable today  No cp or palpitations or headaches or edema  No side effects to medicines  BP Readings from Last 3 Encounters:  01/31/22 124/78  09/22/21 115/73  01/27/21 126/78    Benazepril hct 10-12.5 mg daily   His cuff runs high - was old/ tested it today   Lab Results  Component Value Date   CREATININE 1.05  01/23/2022   BUN 11 01/23/2022   NA 143 01/23/2022   K 4.1 01/23/2022   CL 104 01/23/2022   CO2 31 01/23/2022   Hypothyroidism  Pt has no clinical changes No change in energy level/ hair or skin/ edema and no tremor Lab Results  Component Value Date   TSH 1.95 01/23/2022    Levothyroxine 88 mcg daily    Hyperlipidemia Lab Results  Component Value Date   CHOL 206 (H) 01/23/2022   CHOL 185 01/20/2021   CHOL 195 01/26/2020   Lab Results  Component Value Date   HDL 35.70 (L) 01/23/2022   HDL 33.30 (L) 01/20/2021   HDL 30.50 (L) 01/26/2020   Lab Results  Component Value Date   LDLCALC 144 (H) 01/23/2022   LDLCALC 127 (H) 01/20/2021   LDLCALC 133 (H) 01/26/2020   Lab Results  Component Value Date   TRIG 132.0 01/23/2022   TRIG 128.0 01/20/2021   TRIG 156.0 (H) 01/26/2020   Lab Results  Component Value Date   CHOLHDL 6 01/23/2022   CHOLHDL 6 01/20/2021   CHOLHDL 6 01/26/2020   Lab Results  Component Value Date   LDLDIRECT 103.0 01/13/2016   LDLDIRECT 80.4 11/25/2012   LDLDIRECT 156.2 12/01/2011   Declines statin in  the past   The 10-year ASCVD risk score (Arnett DK, et al., 2019) is: 19.5%   Values used to calculate the score:     Age: 70 years     Sex: Male     Is Non-Hispanic African American: No     Diabetic: No     Tobacco smoker: No     Systolic Blood Pressure: 585 mmHg     Is BP treated: Yes     HDL Cholesterol: 35.7 mg/dL     Total Cholesterol: 206 mg/dL  Some vasc dz in mother's side of family   Diet is good  No breakfast meats Eats oatmeal   Not much beef but some roast beef    Glucose elevation Lab Results  Component Value Date   HGBA1C 5.3 01/23/2022   Patient Active Problem List   Diagnosis Date Noted   BPH (benign prostatic hyperplasia) 01/31/2022   Medicare welcome exam 01/27/2021   Elevated random blood glucose level 01/17/2016   PSA elevation 01/17/2016   Hyperlipidemia 01/12/2014   Hematuria 12/02/2012   Routine  general medical examination at a health care facility 12/26/2010   Prostate cancer screening 12/26/2010   Family history of colon cancer 12/26/2010   Hypothyroidism 06/22/2009   Essential hypertension 06/22/2009   Past Medical History:  Diagnosis Date   Cataract    HTN (hypertension)    Hyperlipidemia    border line no medication updated 08/24/21   Hypothyroid    Pollen allergies    mild   Past Surgical History:  Procedure Laterality Date   COLONOSCOPY  01/12/2006, 02/10/2011   2007 - Normal (Dr. Michail Sermon) 2013 - mild diverticulosis Carlean Purl)   TONSILLECTOMY  1971   and adnoids   Social History   Tobacco Use   Smoking status: Former    Types: Cigarettes    Quit date: 02/07/1972    Years since quitting: 50.0    Passive exposure: Never   Smokeless tobacco: Never   Tobacco comments:    Smoked for 4 years  Vaping Use   Vaping Use: Never used  Substance Use Topics   Alcohol use: No    Alcohol/week: 0.0 standard drinks of alcohol   Drug use: No   Family History  Problem Relation Age of Onset   Heart disease Mother        valvular disease   Hypertension Mother    Colon polyps Father    Colon cancer Father 27   Hypertension Father    Heart disease Brother    Heart disease Brother    Heart disease Maternal Grandmother    Coronary artery disease Maternal Grandmother    Prostate cancer Maternal Grandfather    Hypertension Paternal Grandfather    Diabetes Neg Hx    Crohn's disease Neg Hx    Esophageal cancer Neg Hx    Rectal cancer Neg Hx    Stomach cancer Neg Hx    No Known Allergies Current Outpatient Medications on File Prior to Visit  Medication Sig Dispense Refill   benazepril-hydrochlorthiazide (LOTENSIN HCT) 10-12.5 MG tablet TAKE ONE TABLET BY MOUTH DAILY 90 tablet 1   Cholecalciferol (VITAMIN D) 50 MCG (2000 UT) tablet Take 2,000 Units by mouth daily.     fluticasone (FLONASE) 50 MCG/ACT nasal spray SPRAY 1 SPRAY INTO EACH NOSTRIL TWICE A DAY (Patient taking  differently: Place 1 spray into both nostrils daily as needed.) 16 g 1   Omega-3 Fatty Acids (FISH OIL) 1200 MG CPDR Take 1 capsule by  mouth.     sildenafil (VIAGRA) 100 MG tablet Take 50 mg by mouth daily as needed for erectile dysfunction.     vitamin C (ASCORBIC ACID) 500 MG tablet Take 500 mg by mouth daily.     Zinc 30 MG TABS Take 1 tablet by mouth daily.     No current facility-administered medications on file prior to visit.     Review of Systems  Constitutional:  Positive for fatigue. Negative for activity change, appetite change, fever and unexpected weight change.  HENT:  Negative for congestion, rhinorrhea, sore throat and trouble swallowing.   Eyes:  Negative for pain, redness, itching and visual disturbance.  Respiratory:  Negative for cough, chest tightness, shortness of breath and wheezing.   Cardiovascular:  Negative for chest pain and palpitations.  Gastrointestinal:  Negative for abdominal pain, blood in stool, constipation, diarrhea and nausea.  Endocrine: Negative for cold intolerance, heat intolerance, polydipsia and polyuria.  Genitourinary:  Negative for difficulty urinating, dysuria, frequency and urgency.  Musculoskeletal:  Positive for arthralgias. Negative for joint swelling and myalgias.       Ankle/foot pain   Skin:  Negative for pallor and rash.  Neurological:  Negative for dizziness, tremors, weakness, numbness and headaches.  Hematological:  Negative for adenopathy. Does not bruise/bleed easily.  Psychiatric/Behavioral:  Negative for decreased concentration and dysphoric mood. The patient is not nervous/anxious.        Objective:   Physical Exam Constitutional:      General: He is not in acute distress.    Appearance: Normal appearance. He is well-developed and normal weight. He is not ill-appearing or diaphoretic.  HENT:     Head: Normocephalic and atraumatic.     Right Ear: Tympanic membrane, ear canal and external ear normal.     Left Ear:  Tympanic membrane, ear canal and external ear normal.     Nose: Nose normal. No congestion.     Mouth/Throat:     Mouth: Mucous membranes are moist.     Pharynx: Oropharynx is clear. No posterior oropharyngeal erythema.  Eyes:     General: No scleral icterus.       Right eye: No discharge.        Left eye: No discharge.     Conjunctiva/sclera: Conjunctivae normal.     Pupils: Pupils are equal, round, and reactive to light.  Neck:     Thyroid: No thyromegaly.     Vascular: No carotid bruit or JVD.  Cardiovascular:     Rate and Rhythm: Normal rate and regular rhythm.     Pulses: Normal pulses.     Heart sounds: Normal heart sounds.     No gallop.  Pulmonary:     Effort: Pulmonary effort is normal. No respiratory distress.     Breath sounds: Normal breath sounds. No wheezing or rales.     Comments: Good air exch Chest:     Chest wall: No tenderness.  Abdominal:     General: Bowel sounds are normal. There is no distension or abdominal bruit.     Palpations: Abdomen is soft. There is no mass.     Tenderness: There is no abdominal tenderness.     Hernia: No hernia is present.  Musculoskeletal:        General: No tenderness.     Cervical back: Normal range of motion and neck supple. No rigidity. No muscular tenderness.     Right lower leg: No edema.     Left lower leg: No  edema.     Comments: No swelling or def of R ankle or foot  Nl perf Nl rom   Lymphadenopathy:     Cervical: No cervical adenopathy.  Skin:    General: Skin is warm and dry.     Coloration: Skin is not pale.     Findings: No erythema or rash.     Comments: Solar lentigines diffusely   Neurological:     Mental Status: He is alert.     Cranial Nerves: No cranial nerve deficit.     Sensory: No sensory deficit.     Motor: No abnormal muscle tone.     Coordination: Coordination normal.     Gait: Gait normal.     Deep Tendon Reflexes: Reflexes are normal and symmetric. Reflexes normal.  Psychiatric:         Mood and Affect: Mood normal.        Cognition and Memory: Cognition normal.           Assessment & Plan:   Problem List Items Addressed This Visit       Cardiovascular and Mediastinum   Essential hypertension    bp in fair control at this time  BP Readings from Last 1 Encounters:  01/31/22 124/78  No changes needed Most recent labs reviewed  Disc lifstyle change with low sodium diet and exercise  Plan to continue benazeprail hct 10-12.5 mg daily        Endocrine   Hypothyroidism    Hypothyroidism  Pt has no clinical changes No change in energy level/ hair or skin/ edema and no tremor Lab Results  Component Value Date   TSH 1.95 01/23/2022    Plan to continue levothyroxine 88 mcg daily      Relevant Medications   levothyroxine (SYNTHROID) 88 MCG tablet     Genitourinary   BPH (benign prostatic hyperplasia)    Sees urology yearly  Psa is improved   No change in nocturia         Other   Elevated random blood glucose level    Lab Results  Component Value Date   HGBA1C 5.3 01/23/2022  disc imp of low glycemic diet and wt loss to prevent DM2       Family history of colon cancer    Colonoscopy 09/2021 with 5 y recall      Hyperlipidemia    Disc goals for lipids and reasons to control them Rev last labs with pt Rev low sat fat diet in detail LDL is up to 144   Does not want to take medicine at this time  Disc dietary change  If agreeable, I think a statin med would be wise for prev of vasc dz       Prostate cancer screening    Lab Results  Component Value Date   PSA 3.88 01/23/2022   PSA 4.87 (H) 01/20/2021   PSA 4.79 (H) 01/26/2020   Sees urology      Right foot pain    Per pt- was shattered in old accident  Now ankle and foot bother him (he is a walker)   Will plan appt with Dr Lorelei Pont May be awkward gait affecting both ? If orthotics would help      Routine general medical examination at a health care facility - Primary     Reviewed health habits including diet and exercise and skin cancer prevention Reviewed appropriate screening tests for age  Also reviewed health mt list, fam hx and  immunization status , as well as social and family history   See HPI Labs reviewed  Declines flu shot Prevnar 20 vaccine given  Due for Tetanus- enc him to get at pharmacy if affordable Colonoscopy 09/2021 with 5 y recall for fam hx Psa is improved, sees urology yearly Great exercise habits, commended       Other Visit Diagnoses     Need for vaccination against Streptococcus pneumoniae       Relevant Orders   Pneumococcal conjugate vaccine 20-valent (Prevnar 20) (Completed)

## 2022-01-31 NOTE — Patient Instructions (Addendum)
You can check with the pharmacy about a tetanus shot   Pneumonia shot today   For cholesterol Avoid red meat/ fried foods/ egg yolks/ fatty breakfast meats/ butter, cheese and high fat dairy/ and shellfish   Let us know when/if you would be interested in cholesterol medicine    To prevent diabetes Try to get most of your carbohydrates from produce (with the exception of white potatoes)  Eat less bread/pasta/rice/snack foods/cereals/sweets and other items from the middle of the grocery store (processed carbs)  Take care of yourself   Consider a follow up with podiatry for the foot/ankle

## 2022-01-31 NOTE — Assessment & Plan Note (Signed)
Lab Results  Component Value Date   PSA 3.88 01/23/2022   PSA 4.87 (H) 01/20/2021   PSA 4.79 (H) 01/26/2020    Sees urology

## 2022-01-31 NOTE — Assessment & Plan Note (Signed)
Improved today H/o BPH Sees urology yearly, rev last note

## 2022-01-31 NOTE — Assessment & Plan Note (Signed)
bp in fair control at this time  BP Readings from Last 1 Encounters:  01/31/22 124/78   No changes needed Most recent labs reviewed  Disc lifstyle change with low sodium diet and exercise  Plan to continue benazeprail hct 10-12.5 mg daily
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
## 2022-01-31 NOTE — Assessment & Plan Note (Signed)
Hypothyroidism  Pt has no clinical changes No change in energy level/ hair or skin/ edema and no tremor Lab Results  Component Value Date   TSH 1.95 01/23/2022    Plan to continue levothyroxine 88 mcg daily

## 2022-01-31 NOTE — Assessment & Plan Note (Signed)
Sees urology yearly  Psa is improved   No change in nocturia

## 2022-01-31 NOTE — Assessment & Plan Note (Signed)
Disc goals for lipids and reasons to control them Rev last labs with pt Rev low sat fat diet in detail LDL is up to 144   Does not want to take medicine at this time  Disc dietary change  If agreeable, I think a statin med would be wise for prev of vasc dz

## 2022-02-01 ENCOUNTER — Ambulatory Visit (INDEPENDENT_AMBULATORY_CARE_PROVIDER_SITE_OTHER): Payer: Medicare Other

## 2022-02-01 VITALS — Ht 66.5 in | Wt 178.0 lb

## 2022-02-01 DIAGNOSIS — Z0000 Encounter for general adult medical examination without abnormal findings: Secondary | ICD-10-CM

## 2022-02-01 NOTE — Progress Notes (Signed)
Subjective:   Carlos Larson is a 66 y.o. male who presents for an Initial Medicare Annual Wellness Visit.  Review of Systems    No ROS.  Medicare Wellness Virtual Visit.  Visual/audio telehealth visit, UTA vital signs.   See social history for additional risk factors.   Cardiac Risk Factors include: advanced age (>80mn, >>75women);male gender     Objective:    Today's Vitals   02/01/22 0820  Weight: 178 lb (80.7 kg)  Height: 5' 6.5" (1.689 m)   Body mass index is 28.3 kg/m.     02/01/2022    8:21 AM 04/14/2016    8:39 AM  Advanced Directives  Does Patient Have a Medical Advance Directive? Yes No  Type of [place]amed[place]   Does patient want to make changes to medical advance directive? No - Patient declined   Copy of [place] Chart? No - copy requested     Current Medications (verified) Outpatient Encounter Medications as of 02/01/2022  Medication Sig   benazepril-hydrochlorthiazide (LOTENSIN HCT) 10-12.5 MG tablet TAKE ONE TABLET BY MOUTH DAILY   Cholecalciferol (VITAMIN D) 50 MCG (2000 UT) tablet Take 2,000 Units by mouth daily.   fluticasone (FLONASE) 50 MCG/ACT nasal spray SPRAY 1 SPRAY INTO EACH NOSTRIL TWICE A DAY (Patient taking differently: Place 1 spray into both nostrils daily as needed.)   levothyroxine (SYNTHROID) 88 MCG tablet Take 1 tablet (88 mcg total) by mouth daily before breakfast.   Omega-3 Fatty Acids (FISH OIL) 1200 MG CPDR Take 1 capsule by mouth.   sildenafil (VIAGRA) 100 MG tablet Take 50 mg by mouth daily as needed for erectile dysfunction.   vitamin C (ASCORBIC ACID) 500 MG tablet Take 500 mg by mouth daily.   Zinc 30 MG TABS Take 1 tablet by mouth daily.   No facility-administered encounter medications on file as of 02/01/2022.    Allergies (verified) Patient has no known allergies.   History: Past Medical History:  Diagnosis Date   Cataract    HTN (hypertension)     Hyperlipidemia    border line no medication updated 08/24/21   Hypothyroid    Pollen allergies    mild   Past Surgical History:  Procedure Laterality Date   COLONOSCOPY  01/12/2006, 02/10/2011   2007 - Normal (Dr. SMichail Sermon 2013 - mild diverticulosis (Carlean Purl   TR[place]  and adnoids   Family History  Problem Relation Age of Onset   Heart disease Mother        valvular disease   Hypertension Mother    Colon polyps Father    Colon cancer Father [phone]  Hypertension Father    Heart disease Brother    Heart disease Brother    Heart disease Maternal Grandmother    Coronary artery disease Maternal Grandmother    Prostate cancer Maternal Grandfather    Hypertension Paternal Grandfather    Diabetes Neg Hx    Crohn's disease Neg Hx    Esophageal cancer Neg Hx    Rectal cancer Neg Hx    Stomach cancer Neg Hx    Social History   Socioeconomic History   Marital status: Single    Spouse name: Not on file   Number of children: Not on file   Years of education: Not on file   Highest education level: Not on file  Occupational History   Occupation: Lineman    Employer: [employer]  Tobacco Use  Smoking status: Former    Types: Cigarettes    Quit date: 02/07/1972    Years since quitting: 50.0    Passive exposure: Never   Smokeless tobacco: Never   Tobacco comments:    Smoked for 4 years  Vaping Use   Vaping Use: Never used  Substance and Sexual Activity   Alcohol use: No    Alcohol/week: 0.0 standard drinks of alcohol   Drug use: No   Sexual activity: Not on file  Other Topics Concern   Not on file  Social History Narrative   Married      Regular exercise (lifts weights)      Lineman for [employer]   Social Determinants of Health   Financial Resource Strain: Low Risk  (02/01/2022)   Overall Financial Resource Strain (CARDIA)    Difficulty of Paying Living Expenses: Not hard at all  Food Insecurity: No Food Insecurity (02/01/2022)   Hunger Vital Sign     Worried About Running Out of Food in the Last Year: Never true    Ran Out of Food in the Last Year: Never true  Transportation Needs: No Transportation Needs (02/01/2022)   PRAPARE - Hydrologist (Medical): No    Lack of Transportation (Non-Medical): No  Physical Activity: Insufficiently Active (02/01/2022)   Exercise Vital Sign    Days of Exercise per Week: 6 days    Minutes of Exercise per Session: 20 min  Stress: No Stress Concern Present (02/01/2022)   [place]    Feeling of Stress : Not at all  Social Connections: Unknown (02/01/2022)   Social Connection and Isolation Panel [NHANES]    Frequency of Communication with Friends and Family: Not on file    Frequency of Social Gatherings with Friends and Family: Not on file    Attends Religious Services: Not on file    Active Member of Clubs or Organizations: Not on file    Attends Archivist Meetings: Not on file    Marital Status: Married    Tobacco Counseling Counseling given: Not Answered Tobacco comments: Smoked for 4 years   Clinical Intake:  Pre-visit preparation completed: Yes        Diabetes: No  How often do you need to have someone help you when you read instructions, pamphlets, or other written materials from your doctor or pharmacy?: 1 - Never    Interpreter Needed?: No      Activities of Daily Living    02/01/2022    8:24 AM  In your present state of health, do you have any difficulty performing the following activities:  Hearing? 0  Vision? 0  Difficulty concentrating or making decisions? 0  Walking or climbing stairs? 0  Dressing or bathing? 0  Doing errands, shopping? 0  Preparing Food and eating ? N  Using the Toilet? N  In the past six months, have you accidently leaked urine? N  Do you have problems with loss of bowel control? N  Managing your Medications? N  Managing your  Finances? N  Housekeeping or managing your Housekeeping? N    Patient Care Team: Tower, Wynelle Fanny, MD as PCP - General  Indicate any recent Medical Services you may have received from other than [facility] providers in the past year (date may be approximate).     Assessment:   This is a routine wellness examination for Carlos Larson.  I connected with  Annitta Jersey  on 02/01/22 by a audio enabled telemedicine application and verified that I am speaking with the correct person using two identifiers.  Patient Location: Home  Provider Location: Office/Clinic  I discussed the limitations of evaluation and management by telemedicine. The patient expressed understanding and agreed to proceed.   Hearing/Vision screen Hearing Screening - Comments:: Patient is able to hear conversational tones without difficulty.  No issues reported.   Vision Screening - Comments:: Followed by [facility] Wears corrective lenses They have seen their ophthalmologist in the last 12 months.    Dietary issues and exercise activities discussed: Current Exercise Habits: Structured exercise class, Type of exercise: walking;strength training/weights, Time (Minutes): 25, Frequency (Times/Week): 6, Weekly Exercise (Minutes/Week): 150, Intensity: Moderate Healthy diet Good water intake   Goals Addressed             This Visit's Progress    Maintain healthy lifestyle       Eat less red meat       Depression Screen    02/01/2022    8:23 AM 01/27/2021    8:32 AM 01/27/2020    9:15 AM 01/24/2019    2:18 PM 01/21/2018   10:58 AM 01/17/2017   12:28 PM  PHQ 2/9 Scores  PHQ - 2 Score 0 0 0 0 0 0  PHQ- 9 Score   0       Fall Risk    02/01/2022    8:27 AM 01/27/2021    8:26 AM  [place] in the past year? 0 0  Number falls in past yr: 0   Injury with Fall? 0   Follow up Falls evaluation completed Falls evaluation completed    [place]: Home free of loose  throw rugs in walkways, pet beds, electrical cords, etc? Yes  Adequate lighting in your home to reduce risk of falls? Yes   ASSISTIVE DEVICES UTILIZED TO PREVENT FALLS: Life alert? No  Use of a cane, walker or w/c? No  Grab bars in the bathroom? No  Shower chair or bench in shower? No  Elevated toilet seat or a handicapped toilet? No   TIMED UP AND GO: Was the test performed? No .   Cognitive Function:        02/01/2022    8:41 AM  6CIT Screen  What Year? 0 points  What month? 0 points  What time? 0 points  Count back from 20 0 points  Months in reverse 0 points  Repeat phrase 0 points  Total Score 0 points    Immunizations Immunization History  Administered Date(s) Administered   Influenza Split 10/22/2010   Influenza,inj,Quad PF,6+ Mos 11/14/2014, 11/19/2017, 10/18/2018   Influenza-Unspecified 11/16/2013, 11/06/2015, 12/05/2016, 11/20/2017   PNEUMOCOCCAL CONJUGATE-20 01/31/2022   Td 12/11/2001, 08/16/2009   Tdap 01/31/2022   Zoster Recombinat (Shingrix) 01/24/2019, 04/08/2019    Screening Tests Health Maintenance  Topic Date Due   INFLUENZA VACCINE  05/07/2022 (Originally 09/06/2021)   Hepatitis C Screening  01/07/2023 (Originally 01/31/1974)   COVID-19 Vaccine (1) 02/13/2024 (Originally 08/02/1956)   Medicare Annual Wellness (AWV)  02/02/2023   COLONOSCOPY (Pts 45-74yr Insurance coverage will need to be confirmed)  09/23/2026   DTaP/Tdap/Td (4 - Td or Tdap) 02/01/2032   Pneumonia Vaccine 66 Years old  Completed   Zoster Vaccines- Shingrix  Completed   HPV VACCINES  Aged Out   Health Maintenance There are no preventive care reminders to display for this patient.  Lung Cancer  Screening: (Low Dose CT Chest recommended if Age 16-80 years, 30 pack-year currently smoking OR have quit w/in 15years.) does not qualify.   Hepatitis C Screening: deferred for future scheduling as long as no charge.  Vision Screening: Recommended annual ophthalmology exams for early  detection of glaucoma and other disorders of the eye.  Dental Screening: Recommended annual dental exams for proper oral hygiene.  Community Resource Referral / Chronic Care Management: CRR required this visit?  No   CCM required this visit?  No      Plan:     I have personally reviewed and noted the following in the patient's chart:   Medical and social history Use of alcohol, tobacco or illicit drugs  Current medications and supplements including opioid prescriptions. Patient is not currently taking opioid prescriptions. Functional ability and status Nutritional status Physical activity Advanced directives List of other physicians Hospitalizations, surgeries, and ER visits in previous 12 months Vitals Screenings to include cognitive, depression, and falls Referrals and appointments  In addition, I have reviewed and discussed with patient certain preventive protocols, quality metrics, and best practice recommendations. A written personalized care plan for preventive services as well as general preventive health recommendations were provided to patient.     Leta Jungling, LPN   29/52/8413

## 2022-02-01 NOTE — Patient Instructions (Addendum)
Carlos Larson , Thank you for taking time to come for your Medicare Wellness Visit. I appreciate your ongoing commitment to your health goals. Please review the following plan we discussed and let me know if I can assist you in the future.   These are the goals we discussed:  Goals      Maintain healthy lifestyle     Eat less red meat        This is a list of the screening recommended for you and due dates:  Health Maintenance  Topic Date Due   Flu Shot  05/07/2022*   Hepatitis C Screening: USPSTF Recommendation to screen - Ages 18-79 yo.  01/07/2023*   COVID-19 Vaccine (1) 02/13/2024*   Medicare Annual Wellness Visit  02/02/2023   Colon Cancer Screening  09/23/2026   DTaP/Tdap/Td vaccine (4 - Td or Tdap) 02/01/2032   Pneumonia Vaccine  Completed   Zoster (Shingles) Vaccine  Completed   HPV Vaccine  Aged Out  *Topic was postponed. The date shown is not the original due date.   Advanced directives: End of life planning; Advance aging; Advanced directives discussed.  Copy of current HCPOA/Living Will requested.    Conditions/risks identified: none new  Next appointment: Follow up in one year for your annual wellness visit.   Preventive Care 30 Years and Older, Male  Preventive care refers to lifestyle choices and visits with your health care provider that can promote health and wellness. What does preventive care include? A yearly physical exam. This is also called an annual well check. Dental exams once or twice a year. Routine eye exams. Ask your health care provider how often you should have your eyes checked. Personal lifestyle choices, including: Daily care of your teeth and gums. Regular physical activity. Eating a healthy diet. Avoiding tobacco and drug use. Limiting alcohol use. Practicing safe sex. Taking low doses of aspirin every day. Taking vitamin and mineral supplements as recommended by your health care provider. What happens during an annual well check? The  services and screenings done by your health care provider during your annual well check will depend on your age, overall health, lifestyle risk factors, and family history of disease. Counseling  Your health care provider may ask you questions about your: Alcohol use. Tobacco use. Drug use. Emotional well-being. Home and relationship well-being. Sexual activity. Eating habits. History of falls. Memory and ability to understand (cognition). Work and work Statistician. Screening  You may have the following tests or measurements: Height, weight, and BMI. Blood pressure. Lipid and cholesterol levels. These may be checked every 5 years, or more frequently if you are over 51 years old. Skin check. Lung cancer screening. You may have this screening every year starting at age 70 if you have a 30-pack-year history of smoking and currently smoke or have quit within the past 15 years. Fecal occult blood test (FOBT) of the stool. You may have this test every year starting at age 64. Flexible sigmoidoscopy or colonoscopy. You may have a sigmoidoscopy every 5 years or a colonoscopy every 10 years starting at age 54. Prostate cancer screening. Recommendations will vary depending on your family history and other risks. Hepatitis C blood test. Hepatitis B blood test. Sexually transmitted disease (STD) testing. Diabetes screening. This is done by checking your blood sugar (glucose) after you have not eaten for a while (fasting). You may have this done every 1-3 years. Abdominal aortic aneurysm (AAA) screening. You may need this if you are a current or  former smoker. Osteoporosis. You may be screened starting at age 33 if you are at high risk. Talk with your health care provider about your test results, treatment options, and if necessary, the need for more tests. Vaccines  Your health care provider may recommend certain vaccines, such as: Influenza vaccine. This is recommended every year. Tetanus,  diphtheria, and acellular pertussis (Tdap, Td) vaccine. You may need a Td booster every 10 years. Zoster vaccine. You may need this after age 1. Pneumococcal 13-valent conjugate (PCV13) vaccine. One dose is recommended after age 21. Pneumococcal polysaccharide (PPSV23) vaccine. One dose is recommended after age 78. Talk to your health care provider about which screenings and vaccines you need and how often you need them. This information is not intended to replace advice given to you by your health care provider. Make sure you discuss any questions you have with your health care provider. Document Released: 02/19/2015 Document Revised: 10/13/2015 Document Reviewed: 11/24/2014 Elsevier Interactive Patient Education  2017 [employer] Prevention in the Home Falls can cause injuries. They can happen to people of all ages. There are many things you can do to make your home safe and to help prevent falls. What can I do on the outside of my home? Regularly fix the edges of walkways and driveways and fix any cracks. Remove anything that might make you trip as you walk through a door, such as a raised step or threshold. Trim any bushes or trees on the path to your home. Use bright outdoor lighting. Clear any walking paths of anything that might make someone trip, such as rocks or tools. Regularly check to see if handrails are loose or broken. Make sure that both sides of any steps have handrails. Any raised decks and porches should have guardrails on the edges. Have any leaves, snow, or ice cleared regularly. Use sand or salt on walking paths during winter. Clean up any spills in your garage right away. This includes oil or grease spills. What can I do in the bathroom? Use night lights. Install grab bars by the toilet and in the tub and shower. Do not use towel bars as grab bars. Use non-skid mats or decals in the tub or shower. If you need to sit down in the shower, use a plastic,  non-slip stool. Keep the floor dry. Clean up any water that spills on the floor as soon as it happens. Remove soap buildup in the tub or shower regularly. Attach bath mats securely with double-sided non-slip rug tape. Do not have throw rugs and other things on the floor that can make you trip. What can I do in the bedroom? Use night lights. Make sure that you have a light by your bed that is easy to reach. Do not use any sheets or blankets that are too big for your bed. They should not hang down onto the floor. Have a firm chair that has side arms. You can use this for support while you get dressed. Do not have throw rugs and other things on the floor that can make you trip. What can I do in the kitchen? Clean up any spills right away. Avoid walking on wet floors. Keep items that you use a lot in easy-to-reach places. If you need to reach something above you, use a strong step stool that has a grab bar. Keep electrical cords out of the way. Do not use floor polish or wax that makes floors slippery. If you must use wax, use  non-skid floor wax. Do not have throw rugs and other things on the floor that can make you trip. What can I do with my stairs? Do not leave any items on the stairs. Make sure that there are handrails on both sides of the stairs and use them. Fix handrails that are broken or loose. Make sure that handrails are as long as the stairways. Check any carpeting to make sure that it is firmly attached to the stairs. Fix any carpet that is loose or worn. Avoid having throw rugs at the top or bottom of the stairs. If you do have throw rugs, attach them to the floor with carpet tape. Make sure that you have a light switch at the top of the stairs and the bottom of the stairs. If you do not have them, ask someone to add them for you. What else can I do to help prevent falls? Wear shoes that: Do not have high heels. Have rubber bottoms. Are comfortable and fit you well. Are closed  at the toe. Do not wear sandals. If you use a stepladder: Make sure that it is fully opened. Do not climb a closed stepladder. Make sure that both sides of the stepladder are locked into place. Ask someone to hold it for you, if possible. Clearly mark and make sure that you can see: Any grab bars or handrails. First and last steps. Where the edge of each step is. Use tools that help you move around (mobility aids) if they are needed. These include: Canes. Walkers. Scooters. Crutches. Turn on the lights when you go into a dark area. Replace any light bulbs as soon as they burn out. Set up your furniture so you have a clear path. Avoid moving your furniture around. If any of your floors are uneven, fix them. If there are any pets around you, be aware of where they are. Review your medicines with your doctor. Some medicines can make you feel dizzy. This can increase your chance of falling. Ask your doctor what other things that you can do to help prevent falls. This information is not intended to replace advice given to you by your health care provider. Make sure you discuss any questions you have with your health care provider. Document Released: 11/19/2008 Document Revised: 07/01/2015 Document Reviewed: 02/27/2014 Elsevier Interactive Patient Education  2017 [employer].

## 2022-02-06 NOTE — Progress Notes (Unsigned)
    Nicolo Tomko T. Kamsiyochukwu Buist, MD, [place] [facility] [place], [address]  Phone: [phone]  FAX: [phone]  Carlos Larson - 67 y.o. male  MRN [id number]  Date of Birth: 02/18/1955  Date: 02/08/2022  PCP: Abner Greenspan, MD  Referral: Abner Greenspan, MD  No chief complaint on file.  Subjective:   Carlos Larson is a 67 y.o. very pleasant male patient with There is no height or weight on file to calculate BMI. who presents with the following:  Very pleasant patient presents with right-sided ankle pain.  He is also having some pain in the right foot.    Review of Systems is noted in the HPI, as appropriate  Objective:   There were no vitals taken for this visit.  GEN: No acute distress; alert,appropriate. PULM: Breathing comfortably in no respiratory distress PSYCH: Normally interactive.   Laboratory and Imaging Data:  Assessment and Plan:   ***

## 2022-02-08 ENCOUNTER — Ambulatory Visit (INDEPENDENT_AMBULATORY_CARE_PROVIDER_SITE_OTHER): Payer: BLUE CROSS/BLUE SHIELD | Admitting: Family Medicine

## 2022-02-08 ENCOUNTER — Encounter: Payer: Self-pay | Admitting: Family Medicine

## 2022-02-08 VITALS — BP 120/72 | HR 77 | Temp 97.4°F | Ht 66.5 in | Wt 181.0 lb

## 2022-02-08 DIAGNOSIS — M25571 Pain in right ankle and joints of right foot: Secondary | ICD-10-CM | POA: Diagnosis not present

## 2022-02-08 DIAGNOSIS — G8929 Other chronic pain: Secondary | ICD-10-CM | POA: Diagnosis not present

## 2022-02-08 DIAGNOSIS — M7741 Metatarsalgia, right foot: Secondary | ICD-10-CM | POA: Diagnosis not present

## 2022-02-08 MED ORDER — CELECOXIB 200 MG PO CAPS: 200.0000 mg | ORAL_CAPSULE | Freq: Every day | ORAL | 2 refills | Status: DC

## 2022-02-17 DIAGNOSIS — K080 Exfoliation of teeth due to systemic causes: Secondary | ICD-10-CM | POA: Diagnosis not present

## 2022-07-12 ENCOUNTER — Ambulatory Visit: Payer: Medicare Other | Admitting: Podiatry

## 2022-07-12 ENCOUNTER — Other Ambulatory Visit: Payer: Self-pay | Admitting: Podiatry

## 2022-07-12 ENCOUNTER — Ambulatory Visit (INDEPENDENT_AMBULATORY_CARE_PROVIDER_SITE_OTHER): Payer: Medicare Other

## 2022-07-12 ENCOUNTER — Encounter: Payer: Self-pay | Admitting: Podiatry

## 2022-07-12 DIAGNOSIS — M7751 Other enthesopathy of right foot: Secondary | ICD-10-CM

## 2022-07-12 DIAGNOSIS — M778 Other enthesopathies, not elsewhere classified: Secondary | ICD-10-CM

## 2022-07-12 MED ORDER — DEXAMETHASONE SODIUM PHOSPHATE 120 MG/30ML IJ SOLN
2.0000 mg | Freq: Once | INTRAMUSCULAR | Status: AC
  Administered 2022-07-12: 2 mg via INTRA_ARTICULAR

## 2022-07-12 NOTE — Progress Notes (Signed)
Subjective:  Patient ID: Carlos Larson, male    DOB: May 01, 1955,  MRN: [id number] HPI Chief Complaint  Patient presents with   Foot Pain    5th MPJ right - swollen, "feels funny to walk" x 6 weeks, active walker, but has stopped now for 3 weeks, seen sports med doc-Rx'd celebrex-helped ankle which was hurting at the time, but not the outside of the foot   New Patient (Initial Visit)    Est pt 2016    67 y.o. male presents with the above complaint.   ROS: Denies fever chills nausea vomit muscle aches pains calf pain back pain chest pain shortness of breath.  Past Medical History:  Diagnosis Date   Cataract    HTN (hypertension)    Hyperlipidemia    border line no medication updated 08/24/21   Hypothyroid    Pollen allergies    mild   Past Surgical History:  Procedure Laterality Date   COLONOSCOPY  01/12/2006, 02/10/2011   2007 - Normal (Dr. Bosie Clos) 2013 - mild diverticulosis Leone Payor)   TONSILLECTOMY  1971   and adnoids    Current Outpatient Medications:    benazepril-hydrochlorthiazide (LOTENSIN HCT) 10-12.5 MG tablet, TAKE ONE TABLET BY MOUTH DAILY, Disp: 90 tablet, Rfl: 1   Cholecalciferol (VITAMIN D) 50 MCG (2000 UT) tablet, Take 2,000 Units by mouth daily., Disp: , Rfl:    fluticasone (FLONASE) 50 MCG/ACT nasal spray, SPRAY 1 SPRAY INTO EACH NOSTRIL TWICE A DAY (Patient taking differently: Place 1 spray into both nostrils daily as needed.), Disp: 16 g, Rfl: 1   levothyroxine (SYNTHROID) 88 MCG tablet, Take 1 tablet (88 mcg total) by mouth daily before breakfast., Disp: 90 tablet, Rfl: 3   Omega-3 Fatty Acids (FISH OIL) 1200 MG CPDR, Take 1 capsule by mouth., Disp: , Rfl:    sildenafil (VIAGRA) 100 MG tablet, Take 50 mg by mouth daily as needed for erectile dysfunction., Disp: , Rfl:    vitamin C (ASCORBIC ACID) 500 MG tablet, Take 500 mg by mouth daily., Disp: , Rfl:    Zinc 30 MG TABS, Take 1 tablet by mouth daily., Disp: , Rfl:   No Known Allergies Review of  Systems Objective:  There were no vitals filed for this visit.  General: Well developed, nourished, in no acute distress, alert and oriented x3   Dermatological: Skin is warm, dry and supple bilateral. Nails x 10 are well maintained; remaining integument appears unremarkable at this time. There are no open sores, no preulcerative lesions, no rash or signs of infection present.  Vascular: Dorsalis Pedis artery and Posterior Tibial artery pedal pulses are 2/4 bilateral with immedate capillary fill time. Pedal hair growth present. No varicosities and no lower extremity edema present bilateral.   Neruologic: Grossly intact via light touch bilateral. Vibratory intact via tuning fork bilateral. Protective threshold with Semmes Wienstein monofilament intact to all pedal sites bilateral. Patellar and Achilles deep tendon reflexes 2+ bilateral. No Babinski or clonus noted bilateral.   Musculoskeletal: No gross boney pedal deformities bilateral. No pain, crepitus, or limitation noted with foot and ankle range of motion bilateral. Muscular strength 5/5 in all groups tested bilateral.  Firm fibrotic mass plantar lateral aspect of the right foot beneath the fifth metatarsal head right most consistent with a chronic bursitis  Gait: Unassisted, Nonantalgic.    Radiographs:  Radiographs taken today demonstrate soft tissue swelling along the lateral and plantar lateral aspect of the fifth metatarsal head of the right foot otherwise no significant osseous  abnormalities.  Assessment & Plan:   Assessment: Chronic bursitis fifth met right or fibroma.    Plan: We discussed appropriate shoe gear and injection therapy today.  We did inject the right fifth metatarsal phalangeal joint area plantarly and laterally with Kenalog tolerated procedure well.  I do think a change in shoe gear will make a big difference for him.     Syan Cullimore T. [place], [place]

## 2022-07-25 ENCOUNTER — Other Ambulatory Visit: Payer: Self-pay | Admitting: Family Medicine

## 2022-08-16 DIAGNOSIS — R972 Elevated prostate specific antigen [PSA]: Secondary | ICD-10-CM | POA: Diagnosis not present

## 2022-08-25 DIAGNOSIS — K080 Exfoliation of teeth due to systemic causes: Secondary | ICD-10-CM | POA: Diagnosis not present

## 2022-09-14 DIAGNOSIS — N528 Other male erectile dysfunction: Secondary | ICD-10-CM | POA: Diagnosis not present

## 2022-09-14 DIAGNOSIS — R972 Elevated prostate specific antigen [PSA]: Secondary | ICD-10-CM | POA: Diagnosis not present

## 2022-09-14 DIAGNOSIS — N400 Enlarged prostate without lower urinary tract symptoms: Secondary | ICD-10-CM | POA: Diagnosis not present

## 2022-11-29 DIAGNOSIS — H5213 Myopia, bilateral: Secondary | ICD-10-CM | POA: Diagnosis not present

## 2022-11-29 DIAGNOSIS — H0288A Meibomian gland dysfunction right eye, upper and lower eyelids: Secondary | ICD-10-CM | POA: Diagnosis not present

## 2022-11-29 DIAGNOSIS — H25013 Cortical age-related cataract, bilateral: Secondary | ICD-10-CM | POA: Diagnosis not present

## 2022-11-29 DIAGNOSIS — H2513 Age-related nuclear cataract, bilateral: Secondary | ICD-10-CM | POA: Diagnosis not present

## 2022-11-29 DIAGNOSIS — H0288B Meibomian gland dysfunction left eye, upper and lower eyelids: Secondary | ICD-10-CM | POA: Diagnosis not present

## 2022-11-30 DIAGNOSIS — D171 Benign lipomatous neoplasm of skin and subcutaneous tissue of trunk: Secondary | ICD-10-CM | POA: Diagnosis not present

## 2023-01-16 ENCOUNTER — Ambulatory Visit (INDEPENDENT_AMBULATORY_CARE_PROVIDER_SITE_OTHER): Payer: Medicare Other

## 2023-01-16 VITALS — Ht 67.0 in | Wt 181.0 lb

## 2023-01-16 DIAGNOSIS — Z0000 Encounter for general adult medical examination without abnormal findings: Secondary | ICD-10-CM | POA: Diagnosis not present

## 2023-01-16 NOTE — Patient Instructions (Addendum)
Carlos Larson , Thank you for taking time to come for your Medicare Wellness Visit. I appreciate your ongoing commitment to your health goals. Please review the following plan we discussed and let me know if I can assist you in the future.   Referrals/Orders/Follow-Ups/Clinician Recommendations: none  This is a list of the screening recommended for you and due dates:  Health Maintenance  Topic Date Due   Hepatitis C Screening  Never done   Flu Shot  09/07/2022   COVID-19 Vaccine (1 - 2023-24 season) Never done   Medicare Annual Wellness Visit  01/16/2024   Colon Cancer Screening  09/23/2026   DTaP/Tdap/Td vaccine (4 - Td or Tdap) 02/01/2032   Pneumonia Vaccine  Completed   Zoster (Shingles) Vaccine  Completed   HPV Vaccine  Aged Out    Advanced directives: (Declined) Advance directive discussed with you today. Even though you declined this today, please call our office should you change your mind, and we can give you the proper paperwork for you to fill out.  Next Medicare Annual Wellness Visit scheduled for next year: Yes 01/17/2024 @ 11:30am telephone

## 2023-01-16 NOTE — Progress Notes (Signed)
Subjective:   Carlos Larson is a 67 y.o. male who presents for Medicare Annual/Subsequent preventive examination.  Visit Complete: Virtual I connected with  Carlos Larson on 01/16/23 by a audio enabled telemedicine application and verified that I am speaking with the correct person using two identifiers.  Patient Location: Home  Provider Location: Home Office  I discussed the limitations of evaluation and management by telemedicine. The patient expressed understanding and agreed to proceed.  Vital Signs: Because this visit was a virtual/telehealth visit, some criteria may be missing or patient reported. Any vitals not documented were not able to be obtained and vitals that have been documented are patient reported.  Patient Medicare AWV questionnaire was completed by the patient on (not done); I have confirmed that all information answered by patient is correct and no changes since this date.  Cardiac Risk Factors include: advanced age (>55men, >6 women);hypertension;male gender;dyslipidemia    Objective:    Today's Vitals   01/16/23 1038  Weight: 181 lb (82.1 kg)  Height: 5\' 7"  (1.702 m)   Body mass index is 28.35 kg/m.     01/16/2023   10:48 AM 02/01/2022    8:21 AM 04/14/2016    8:39 AM  Advanced Directives  Does Patient Have a Medical Advance Directive? No Yes No  Type of Special educational needs teacher of [place];Living will   Does patient want to make changes to medical advance directive?  No - Patient declined   Copy of Healthcare Power of Attorney in Chart?  No - copy requested     Current Medications (verified) Outpatient Encounter Medications as of 01/16/2023  Medication Sig   benazepril-hydrochlorthiazide (LOTENSIN HCT) 10-12.5 MG tablet TAKE 1 TABLET BY MOUTH DAILY   Cholecalciferol (VITAMIN D) 50 MCG (2000 UT) tablet Take 2,000 Units by mouth daily.   fluticasone (FLONASE) 50 MCG/ACT nasal spray SPRAY 1 SPRAY INTO EACH NOSTRIL TWICE A DAY (Patient  taking differently: Place 1 spray into both nostrils daily as needed.)   levothyroxine (SYNTHROID) 88 MCG tablet Take 1 tablet (88 mcg total) by mouth daily before breakfast.   Omega-3 Fatty Acids (FISH OIL) 1200 MG CPDR Take 1 capsule by mouth.   sildenafil (VIAGRA) 100 MG tablet Take 50 mg by mouth daily as needed for erectile dysfunction.   vitamin C (ASCORBIC ACID) 500 MG tablet Take 500 mg by mouth daily.   Zinc 30 MG TABS Take 1 tablet by mouth daily.   No facility-administered encounter medications on file as of 01/16/2023.    Allergies (verified) Patient has no known allergies.   History: Past Medical History:  Diagnosis Date   Cataract    HTN (hypertension)    Hyperlipidemia    border line no medication updated 08/24/21   Hypothyroid    Pollen allergies    mild   Past Surgical History:  Procedure Laterality Date   COLONOSCOPY  01/12/2006, 02/10/2011   2007 - Normal (Dr. Bosie Clos) 2013 - mild diverticulosis Carlos Larson)   TONSILLECTOMY  1971   and adnoids   Family History  Problem Relation Age of Onset   Heart disease Mother        valvular disease   Hypertension Mother    Colon polyps Father    Colon cancer Father 71   Hypertension Father    Heart disease Brother    Heart disease Brother    Heart disease Maternal Grandmother    Coronary artery disease Maternal Grandmother    Prostate cancer Maternal Grandfather  Hypertension Paternal Grandfather    Diabetes Neg Hx    Crohn's disease Neg Hx    Esophageal cancer Neg Hx    Rectal cancer Neg Hx    Stomach cancer Neg Hx    Social History   Socioeconomic History   Marital status: Single    Spouse name: Not on file   Number of children: Not on file   Years of education: Not on file   Highest education level: Not on file  Occupational History   Occupation: Lineman    Employer: [employer]  Tobacco Use   Smoking status: Former    Current packs/day: 0.00    Types: Cigarettes    Quit date: 02/07/1972    Years  since quitting: 50.9    Passive exposure: Never   Smokeless tobacco: Never   Tobacco comments:    Smoked for 4 years  Vaping Use   Vaping status: Never Used  Substance and Sexual Activity   Alcohol use: No    Alcohol/week: 0.0 standard drinks of alcohol   Drug use: No   Sexual activity: Not on file  Other Topics Concern   Not on file  Social History Narrative   Married      Regular exercise (lifts weights)      Lineman for [employer]   Social Determinants of Health   Financial Resource Strain: Low Risk  (01/16/2023)   Overall Financial Resource Strain (CARDIA)    Difficulty of Paying Living Expenses: Not hard at all  Food Insecurity: No Food Insecurity (01/16/2023)   Hunger Vital Sign    Worried About Running Out of Food in the Last Year: Never true    Ran Out of Food in the Last Year: Never true  Transportation Needs: No Transportation Needs (01/16/2023)   PRAPARE - Administrator, Civil Service (Medical): No    Lack of Transportation (Non-Medical): No  Physical Activity: Sufficiently Active (01/16/2023)   Exercise Vital Sign    Days of Exercise per Week: 5 days    Minutes of Exercise per Session: 60 min  Stress: No Stress Concern Present (01/16/2023)   [employer] of Occupational Health - Occupational Stress Questionnaire    Feeling of Stress : Not at all  Social Connections: Moderately Integrated (01/16/2023)   Social Connection and Isolation Panel [NHANES]    Frequency of Communication with Friends and Family: More than three times a week    Frequency of Social Gatherings with Friends and Family: More than three times a week    Attends Religious Services: More than 4 times per year    Active Member of [employer] or Organizations: No    Attends Engineer, structural: Never    Marital Status: Married    Tobacco Counseling Counseling given: Not Answered Tobacco comments: Smoked for 4 years   Clinical Intake:  Pre-visit preparation  completed: No  Pain : No/denies pain   BMI - recorded: 28.35 Nutritional Status: BMI 25 -29 Overweight Nutritional Risks: None Diabetes: No  How often do you need to have someone help you when you read instructions, pamphlets, or other written materials from your doctor or pharmacy?: 1 - Never  Interpreter Needed?: No  Comments: lives with wife Information entered by :: B.Lindsy Cerullo,LPN   Activities of Daily Living    01/16/2023   10:48 AM 02/01/2022    8:24 AM  In your present state of health, do you have any difficulty performing the following activities:  Hearing? 0 0  Vision? 0 0  Difficulty concentrating or making decisions? 0 0  Walking or climbing stairs? 0 0  Dressing or bathing? 0 0  Doing errands, shopping? 0 0  Preparing Food and eating ? N N  Using the Toilet? N N  In the past six months, have you accidently leaked urine? N N  Do you have problems with loss of bowel control? N N  Managing your Medications? N N  Managing your Finances? N N  Housekeeping or managing your Housekeeping? N N    Patient Care Team: Tower, Audrie Gallus, MD as PCP - General  Indicate any recent Medical Services you may have received from other than [facility] providers in the past year (date may be approximate).     Assessment:   This is a routine wellness examination for Russel.  Hearing/Vision screen Hearing Screening - Comments:: Pt says his hearing is good Vision Screening - Comments:: Pt says his vision is good w/glasses Dr Clydene Pugh   Goals Addressed             This Visit's Progress    Maintain healthy lifestyle   On track    Eat less red meat       Depression Screen    01/16/2023   10:46 AM 02/01/2022    8:23 AM 01/27/2021    8:32 AM 01/27/2020    9:15 AM 01/24/2019    2:18 PM 01/21/2018   10:58 AM 01/17/2017   12:28 PM  PHQ 2/9 Scores  PHQ - 2 Score 0 0 0 0 0 0 0  PHQ- 9 Score    0       Fall Risk    01/16/2023   10:43 AM 02/01/2022    8:27 AM 01/27/2021     8:26 AM  Fall Risk   Falls in the past year? 1 0 0  Number falls in past yr: 0 0   Injury with Fall? 0 0   Risk for fall due to : No Fall Risks    Follow up Education provided;Falls prevention discussed Falls evaluation completed Falls evaluation completed    MEDICARE RISK AT HOME: Medicare Risk at Home Any stairs in or around the home?: Yes If so, are there any without handrails?: Yes Home free of loose throw rugs in walkways, pet beds, electrical cords, etc?: Yes Adequate lighting in your home to reduce risk of falls?: Yes Life alert?: No Use of a cane, walker or w/c?: No Grab bars in the bathroom?: Yes Shower chair or bench in shower?: No Elevated toilet seat or a handicapped toilet?: No  TIMED UP AND GO:  Was the test performed?  No    Cognitive Function:        01/16/2023   10:50 AM 02/01/2022    8:41 AM  6CIT Screen  What Year? 0 points 0 points  What month? 0 points 0 points  What time? 0 points 0 points  Count back from 20 0 points 0 points  Months in reverse 0 points 0 points  Repeat phrase 0 points 0 points  Total Score 0 points 0 points    Immunizations Immunization History  Administered Date(s) Administered   Influenza Split 10/22/2010   Influenza,inj,Quad PF,6+ Mos 11/14/2014, 11/19/2017, 10/18/2018   Influenza-Unspecified 11/16/2013, 11/06/2015, 12/05/2016, 11/20/2017   PNEUMOCOCCAL CONJUGATE-20 01/31/2022   Td 12/11/2001, 08/16/2009   Tdap 01/31/2022   Zoster Recombinant(Shingrix) 01/24/2019, 04/08/2019    TDAP status: Up to date  Flu Vaccine status: Due, Education has been  provided regarding the importance of this vaccine. Advised may receive this vaccine at local pharmacy or Health Dept. Aware to provide a copy of the vaccination record if obtained from local pharmacy or Health Dept. Verbalized acceptance and understanding.  Pneumococcal vaccine status: Up to date  Covid-19 vaccine status: Declined, Education has been provided regarding  the importance of this vaccine but patient still declined. Advised may receive this vaccine at local pharmacy or Health Dept.or vaccine clinic. Aware to provide a copy of the vaccination record if obtained from local pharmacy or Health Dept. Verbalized acceptance and understanding.  Qualifies for Shingles Vaccine? Yes   Zostavax completed Yes   Shingrix Completed?: Yes  Screening Tests Health Maintenance  Topic Date Due   Hepatitis C Screening  Never done   INFLUENZA VACCINE  09/07/2022   COVID-19 Vaccine (1 - 2023-24 season) Never done   Medicare Annual Wellness (AWV)  01/16/2024   Colonoscopy  09/23/2026   DTaP/Tdap/Td (4 - Td or Tdap) 02/01/2032   Pneumonia Vaccine 53+ Years old  Completed   Zoster Vaccines- Shingrix  Completed   HPV VACCINES  Aged Out    Health Maintenance  Health Maintenance Due  Topic Date Due   Hepatitis C Screening  Never done   INFLUENZA VACCINE  09/07/2022   COVID-19 Vaccine (1 - 2023-24 season) Never done    Colorectal cancer screening: Type of screening: Colonoscopy. Completed 09/22/2021. Repeat every 5 years  Lung Cancer Screening: (Low Dose CT Chest recommended if Age 43-80 years, 20 pack-year currently smoking OR have quit w/in 15years.) does not qualify.   Lung Cancer Screening Referral: no  Additional Screening:  Hepatitis C Screening: does not qualify; Completed no  Vision Screening: Recommended annual ophthalmology exams for early detection of glaucoma and other disorders of the eye. Is the patient up to date with their annual eye exam?  Yes  Who is the provider or what is the name of the office in which the patient attends annual eye exams? Dr Clydene Pugh If pt is not established with a provider, would they like to be referred to a provider to establish care? No .   Dental Screening: Recommended annual dental exams for proper oral hygiene  Diabetic Foot Exam: n/a  Community Resource Referral / Chronic Care Management: CRR required this  visit?  No   CCM required this visit?  No    Plan:     I have personally reviewed and noted the following in the patient's chart:   Medical and social history Use of alcohol, tobacco or illicit drugs  Current medications and supplements including opioid prescriptions. Patient is not currently taking opioid prescriptions. Functional ability and status Nutritional status Physical activity Advanced directives List of other physicians Hospitalizations, surgeries, and ER visits in previous 12 months Vitals Screenings to include cognitive, depression, and falls Referrals and appointments  In addition, I have reviewed and discussed with patient certain preventive protocols, quality metrics, and best practice recommendations. A written personalized care plan for preventive services as well as general preventive health recommendations were provided to patient.    Sue Lush, LPN   [id number]   After Visit Summary: (Declined) Due to this being a telephonic visit, with patients personalized plan was offered to patient but patient Declined AVS at this time   Nurse Notes: The patient states he is doing well and has no concerns or questions at this time.

## 2023-01-28 ENCOUNTER — Telehealth: Payer: Self-pay | Admitting: Family Medicine

## 2023-01-28 DIAGNOSIS — E7800 Pure hypercholesterolemia, unspecified: Secondary | ICD-10-CM

## 2023-01-28 DIAGNOSIS — N400 Enlarged prostate without lower urinary tract symptoms: Secondary | ICD-10-CM

## 2023-01-28 DIAGNOSIS — R739 Hyperglycemia, unspecified: Secondary | ICD-10-CM

## 2023-01-28 DIAGNOSIS — E039 Hypothyroidism, unspecified: Secondary | ICD-10-CM

## 2023-01-28 DIAGNOSIS — I10 Essential (primary) hypertension: Secondary | ICD-10-CM

## 2023-01-28 DIAGNOSIS — Z125 Encounter for screening for malignant neoplasm of prostate: Secondary | ICD-10-CM

## 2023-01-28 NOTE — Telephone Encounter (Signed)
-----   Message from Lovena Neighbours sent at 01/12/2023  1:55 PM EST ----- Regarding: Labs for Monday 12.23.24 Please put physical lab orders in future. Thank you, Denny Peon

## 2023-01-29 ENCOUNTER — Other Ambulatory Visit: Payer: Medicare Other

## 2023-01-29 DIAGNOSIS — I10 Essential (primary) hypertension: Secondary | ICD-10-CM | POA: Diagnosis not present

## 2023-01-29 DIAGNOSIS — Z125 Encounter for screening for malignant neoplasm of prostate: Secondary | ICD-10-CM

## 2023-01-29 DIAGNOSIS — E7800 Pure hypercholesterolemia, unspecified: Secondary | ICD-10-CM | POA: Diagnosis not present

## 2023-01-29 DIAGNOSIS — R739 Hyperglycemia, unspecified: Secondary | ICD-10-CM | POA: Diagnosis not present

## 2023-01-29 DIAGNOSIS — N400 Enlarged prostate without lower urinary tract symptoms: Secondary | ICD-10-CM

## 2023-01-29 DIAGNOSIS — E039 Hypothyroidism, unspecified: Secondary | ICD-10-CM | POA: Diagnosis not present

## 2023-01-29 LAB — COMPREHENSIVE METABOLIC PANEL
ALT: 39 U/L (ref 0–53)
AST: 31 U/L (ref 0–37)
Albumin: 4.6 g/dL (ref 3.5–5.2)
Alkaline Phosphatase: 72 U/L (ref 39–117)
BUN: 14 mg/dL (ref 6–23)
CO2: 32 meq/L (ref 19–32)
Calcium: 9.4 mg/dL (ref 8.4–10.5)
Chloride: 103 meq/L (ref 96–112)
Creatinine, Ser: 1.01 mg/dL (ref 0.40–1.50)
GFR: 76.69 mL/min (ref >60.00)
Glucose, Bld: 98 mg/dL (ref 70–99)
Potassium: 4.6 meq/L (ref 3.5–5.1)
Sodium: 142 meq/L (ref 135–145)
Total Bilirubin: 1.1 mg/dL (ref 0.2–1.2)
Total Protein: 6.9 g/dL (ref 6.0–8.3)

## 2023-01-29 LAB — CBC WITH DIFFERENTIAL/PLATELET
Basophils Absolute: 0 10*3/uL (ref 0.0–0.1)
Basophils Relative: 0.5 % (ref 0.0–3.0)
Eosinophils Absolute: 0.1 10*3/uL (ref 0.0–0.7)
Eosinophils Relative: 1.8 % (ref 0.0–5.0)
HCT: 50.1 % (ref 39.0–52.0)
Hemoglobin: 17 g/dL (ref 13.0–17.0)
Lymphocytes Relative: 22.2 % (ref 12.0–46.0)
Lymphs Abs: 1.4 10*3/uL (ref 0.7–4.0)
MCHC: 33.9 g/dL (ref 30.0–36.0)
MCV: 96.3 fL (ref 78.0–100.0)
Monocytes Absolute: 0.3 10*3/uL (ref 0.1–1.0)
Monocytes Relative: 5.3 % (ref 3.0–12.0)
Neutro Abs: 4.4 10*3/uL (ref 1.4–7.7)
Neutrophils Relative %: 70.2 % (ref 43.0–77.0)
Platelets: 194 10*3/uL (ref 150.0–400.0)
RBC: 5.2 Mil/uL (ref 4.22–5.81)
RDW: 13.7 % (ref 11.5–15.5)
WBC: 6.2 10*3/uL (ref 4.0–10.5)

## 2023-01-29 LAB — LIPID PANEL
Cholesterol: 200 mg/dL (ref 0–200)
HDL: 34.6 mg/dL — ABNORMAL LOW (ref >39.00)
LDL Cholesterol: 135 mg/dL — ABNORMAL HIGH (ref 0–99)
NonHDL: 165.48
Total CHOL/HDL Ratio: 6
Triglycerides: 152 mg/dL — ABNORMAL HIGH (ref 0.0–149.0)
VLDL: 30.4 mg/dL (ref 0.0–40.0)

## 2023-01-29 LAB — HEMOGLOBIN A1C: Hgb A1c MFr Bld: 5.5 % (ref 4.6–6.5)

## 2023-01-29 LAB — PSA, MEDICARE: PSA: 5.59 ng/mL — ABNORMAL HIGH (ref 0.10–4.00)

## 2023-01-29 LAB — TSH: TSH: 2.98 u[IU]/mL (ref 0.35–5.50)

## 2023-02-02 ENCOUNTER — Encounter: Payer: Self-pay | Admitting: Family Medicine

## 2023-02-02 ENCOUNTER — Ambulatory Visit (INDEPENDENT_AMBULATORY_CARE_PROVIDER_SITE_OTHER): Payer: Medicare Other | Admitting: Family Medicine

## 2023-02-02 VITALS — BP 132/68 | HR 73 | Temp 97.5°F | Ht 66.5 in | Wt 184.5 lb

## 2023-02-02 DIAGNOSIS — R972 Elevated prostate specific antigen [PSA]: Secondary | ICD-10-CM

## 2023-02-02 DIAGNOSIS — L309 Dermatitis, unspecified: Secondary | ICD-10-CM

## 2023-02-02 DIAGNOSIS — Z125 Encounter for screening for malignant neoplasm of prostate: Secondary | ICD-10-CM

## 2023-02-02 DIAGNOSIS — I10 Essential (primary) hypertension: Secondary | ICD-10-CM

## 2023-02-02 DIAGNOSIS — E039 Hypothyroidism, unspecified: Secondary | ICD-10-CM | POA: Diagnosis not present

## 2023-02-02 DIAGNOSIS — Z0000 Encounter for general adult medical examination without abnormal findings: Secondary | ICD-10-CM | POA: Diagnosis not present

## 2023-02-02 DIAGNOSIS — E7800 Pure hypercholesterolemia, unspecified: Secondary | ICD-10-CM | POA: Diagnosis not present

## 2023-02-02 DIAGNOSIS — R739 Hyperglycemia, unspecified: Secondary | ICD-10-CM

## 2023-02-02 DIAGNOSIS — N400 Enlarged prostate without lower urinary tract symptoms: Secondary | ICD-10-CM

## 2023-02-02 DIAGNOSIS — Z800 Family history of malignant neoplasm of digestive organs: Secondary | ICD-10-CM

## 2023-02-02 MED ORDER — LEVOTHYROXINE SODIUM 88 MCG PO TABS: 88.0000 ug | ORAL_TABLET | Freq: Every day | ORAL | 3 refills | Status: DC

## 2023-02-02 MED ORDER — BENAZEPRIL-HYDROCHLOROTHIAZIDE 10-12.5 MG PO TABS: 1.0000 | ORAL_TABLET | Freq: Every day | ORAL | 3 refills | Status: DC

## 2023-02-02 NOTE — Assessment & Plan Note (Signed)
bp in fair control at this time  BP Readings from Last 1 Encounters:  02/02/23 132/68   No changes needed Most recent labs reviewed  Disc lifstyle change with low sodium diet and exercise  Plan to continue benazeprail hct 10-12.5 mg daily

## 2023-02-02 NOTE — Assessment & Plan Note (Signed)
Colonoscopy utd 09/2021 with 5 y recall

## 2023-02-02 NOTE — Assessment & Plan Note (Signed)
Mild -bilateral hands have dry skin Small papule on right 4th finger Recommend moisturizer Over the counter cortisone cream prn  Follow up if not improved Avoid hot water and harsh detergents

## 2023-02-02 NOTE — Progress Notes (Signed)
Subjective:    Patient ID: Carlos Larson, male    DOB: 03/14/1955, 67 y.o.   MRN: [id number]  HPI  Here for health maintenance exam and to review chronic medical problems   Wt Readings from Last 3 Encounters:  02/02/23 184 lb 8 oz (83.7 kg)  01/16/23 181 lb (82.1 kg)  02/08/22 181 lb (82.1 kg)   29.33 kg/m  Vitals:   02/02/23 1025  BP: 132/68  Pulse: 73  Temp: (!) 97.5 F (36.4 C)  SpO2: 94%    Immunization History  Administered Date(s) Administered   Influenza Split 10/22/2010   Influenza,inj,Quad PF,6+ Mos 11/14/2014, 11/19/2017, 10/18/2018   Influenza-Unspecified 11/16/2013, 11/06/2015, 12/05/2016, 11/20/2017   PNEUMOCOCCAL CONJUGATE-20 01/31/2022   Td 12/11/2001, 08/16/2009   Tdap 01/31/2022   Zoster Recombinant(Shingrix) 01/24/2019, 04/08/2019    There are no preventive care reminders to display for this patient.  Declines flu shot and covid imm  Has a lesion on his finger right  Little bump  In between fingers  Can itch /not all the time  Forgot to ask derm about it  ? If eczema  Hands stay dry   Had a lipoma removed from his chest ? As well    Prostate health Has BPH Lab Results  Component Value Date   PSA 5.59 (H) 01/29/2023   PSA 3.88 01/23/2022   PSA 4.87 (H) 01/20/2021  MGF had prostate cancer  History of elevated psa  Had bx 9 y ago-was neg   Urology visit Dr Liliane Shi in August - had a prostate exam that day/ stable   No urinary changes or issues Nocturia times one  Occational urgency    Sees urology Also ED- has viagra    Colon cancer screening  Colonoscopy 09/2021 with 5 y recall  Father had colon cancer at 29  Bone health   Falls-none Fractures-none  Supplements   Exercise - walks 1.5 miles 6 days  Also weight machine - uses that regularly  [employer] after grandkids    Mood    01/16/2023   10:46 AM 02/01/2022    8:23 AM 01/27/2021    8:32 AM 01/27/2020    9:15 AM 01/24/2019    2:18 PM  Depression  screen PHQ 2/9  Decreased Interest 0 0 0 0 0  Down, Depressed, Hopeless 0 0 0 0 0  PHQ - 2 Score 0 0 0 0 0  Altered sleeping    0   Tired, decreased energy    0   Change in appetite    0   Feeling bad or failure about yourself     0   Trouble concentrating    0   Moving slowly or fidgety/restless    0   Suicidal thoughts    0   PHQ-9 Score    0   Difficult doing work/chores    Not difficult at all     HTN bp is stable today  No cp or palpitations or headaches or edema  No side effects to medicines  BP Readings from Last 3 Encounters:  02/02/23 132/68  02/08/22 120/72  01/31/22 124/78    Benazepril hct 10-12.5 mg daily   Lab Results  Component Value Date   NA 142 01/29/2023   K 4.6 01/29/2023   CO2 32 01/29/2023   GLUCOSE 98 01/29/2023   BUN 14 01/29/2023   CREATININE 1.01 01/29/2023   CALCIUM 9.4 01/29/2023   GFR 76.69 01/29/2023   GFRNONAA 86.49 11/24/2009  Hypothyroidism  Pt has no clinical changes No change in energy level/ hair or skin/ edema and no tremor Lab Results  Component Value Date   TSH 2.98 01/29/2023     Levothyroxine 88 mcg daily   Hyperlipidemia Lab Results  Component Value Date   CHOL 200 01/29/2023   CHOL 206 (H) 01/23/2022   CHOL 185 01/20/2021   Lab Results  Component Value Date   HDL 34.60 (L) 01/29/2023   HDL 35.70 (L) 01/23/2022   HDL 33.30 (L) 01/20/2021   Lab Results  Component Value Date   LDLCALC 135 (H) 01/29/2023   LDLCALC 144 (H) 01/23/2022   LDLCALC 127 (H) 01/20/2021   Lab Results  Component Value Date   TRIG 152.0 (H) 01/29/2023   TRIG 132.0 01/23/2022   TRIG 128.0 01/20/2021   Lab Results  Component Value Date   CHOLHDL 6 01/29/2023   CHOLHDL 6 01/23/2022   CHOLHDL 6 01/20/2021   Lab Results  Component Value Date   LDLDIRECT 103.0 01/13/2016   LDLDIRECT 80.4 11/25/2012   LDLDIRECT 156.2 12/01/2011   Has declined medicine in the past  Has fam history of CAD  Eating better  Lot of salads /doing  better  Not a lot of beef    Patient Active Problem List   Diagnosis Date Noted   Eczema of hand 02/02/2023   BPH (benign prostatic hyperplasia) 01/31/2022   Elevated random blood glucose level 01/17/2016   PSA elevation 01/17/2016   Hyperlipidemia 01/12/2014   Hematuria 12/02/2012   Routine general medical examination at a health care facility 12/26/2010   Prostate cancer screening 12/26/2010   Family history of colon cancer 12/26/2010   Hypothyroidism 06/22/2009   Essential hypertension 06/22/2009   Past Medical History:  Diagnosis Date   Cataract    HTN (hypertension)    Hyperlipidemia    border line no medication updated 08/24/21   Hypothyroid    Pollen allergies    mild   Past Surgical History:  Procedure Laterality Date   COLONOSCOPY  01/12/2006, 02/10/2011   2007 - Normal (Dr. Bosie Clos) 2013 - mild diverticulosis Leone Payor)   TONSILLECTOMY  1971   and adnoids   Social History   Tobacco Use   Smoking status: Former    Current packs/day: 0.00    Types: Cigarettes    Quit date: 02/07/1972    Years since quitting: 51.0    Passive exposure: Never   Smokeless tobacco: Never   Tobacco comments:    Smoked for 4 years  Vaping Use   Vaping status: Never Used  Substance Use Topics   Alcohol use: No    Alcohol/week: 0.0 standard drinks of alcohol   Drug use: No   Family History  Problem Relation Age of Onset   Heart disease Mother        valvular disease   Hypertension Mother    Colon polyps Father    Colon cancer Father 30   Hypertension Father    Heart disease Brother    Heart disease Brother    Heart disease Maternal Grandmother    Coronary artery disease Maternal Grandmother    Prostate cancer Maternal Grandfather    Hypertension Paternal Grandfather    Diabetes Neg Hx    Crohn's disease Neg Hx    Esophageal cancer Neg Hx    Rectal cancer Neg Hx    Stomach cancer Neg Hx    No Known Allergies Current Outpatient Medications on File Prior to Visit   Medication  Sig Dispense Refill   Cholecalciferol (VITAMIN D) 50 MCG (2000 UT) tablet Take 2,000 Units by mouth daily.     fluticasone (FLONASE) 50 MCG/ACT nasal spray SPRAY 1 SPRAY INTO EACH NOSTRIL TWICE A DAY (Patient taking differently: Place 1 spray into both nostrils daily as needed.) 16 g 1   Omega-3 Fatty Acids (FISH OIL) 1200 MG CPDR Take 1 capsule by mouth.     sildenafil (VIAGRA) 100 MG tablet Take 50 mg by mouth daily as needed for erectile dysfunction.     vitamin C (ASCORBIC ACID) 500 MG tablet Take 500 mg by mouth daily.     Zinc 30 MG TABS Take 1 tablet by mouth daily.     No current facility-administered medications on file prior to visit.    Review of Systems  Constitutional:  Negative for activity change, appetite change, fatigue, fever and unexpected weight change.  HENT:  Negative for congestion, rhinorrhea, sore throat and trouble swallowing.   Eyes:  Negative for pain, redness, itching and visual disturbance.  Respiratory:  Negative for cough, chest tightness, shortness of breath and wheezing.   Cardiovascular:  Negative for chest pain and palpitations.  Gastrointestinal:  Negative for abdominal pain, blood in stool, constipation, diarrhea and nausea.  Endocrine: Negative for cold intolerance, heat intolerance, polydipsia and polyuria.  Genitourinary:  Negative for difficulty urinating, dysuria, frequency and urgency.  Musculoskeletal:  Negative for arthralgias, joint swelling and myalgias.  Skin:  Negative for pallor and rash.       Dry skin on hands  Small bump on right 4th finger -occational itches   Neurological:  Negative for dizziness, tremors, weakness, numbness and headaches.  Hematological:  Negative for adenopathy. Does not bruise/bleed easily.  Psychiatric/Behavioral:  Negative for decreased concentration and dysphoric mood. The patient is not nervous/anxious.        Objective:   Physical Exam Constitutional:      General: He is not in acute  distress.    Appearance: Normal appearance. He is well-developed. He is not ill-appearing or diaphoretic.     Comments: Overweight   HENT:     Head: Normocephalic and atraumatic.     Right Ear: Tympanic membrane, ear canal and external ear normal.     Left Ear: Tympanic membrane, ear canal and external ear normal.     Nose: Nose normal. No congestion.     Mouth/Throat:     Mouth: Mucous membranes are moist.     Pharynx: Oropharynx is clear. No posterior oropharyngeal erythema.  Eyes:     General: No scleral icterus.       Right eye: No discharge.        Left eye: No discharge.     Conjunctiva/sclera: Conjunctivae normal.     Pupils: Pupils are equal, round, and reactive to light.  Neck:     Thyroid: No thyromegaly.     Vascular: No carotid bruit or JVD.  Cardiovascular:     Rate and Rhythm: Normal rate and regular rhythm.     Pulses: Normal pulses.     Heart sounds: Normal heart sounds.     No gallop.  Pulmonary:     Effort: Pulmonary effort is normal. No respiratory distress.     Breath sounds: Normal breath sounds. No wheezing or rales.     Comments: Good air exch Chest:     Chest wall: No tenderness.  Abdominal:     General: Bowel sounds are normal. There is no distension or abdominal bruit.  Palpations: Abdomen is soft. There is no mass.     Tenderness: There is no abdominal tenderness.     Hernia: No hernia is present.  Musculoskeletal:        General: No tenderness.     Cervical back: Normal range of motion and neck supple. No rigidity. No muscular tenderness.     Right lower leg: No edema.     Left lower leg: No edema.  Lymphadenopathy:     Cervical: No cervical adenopathy.  Skin:    General: Skin is warm and dry.     Coloration: Skin is not pale.     Findings: No erythema or rash.     Comments: Solar lentigines diffusely Some sks   Dry skin on hands Small papule on right 4th finger   Neurological:     Mental Status: He is alert.     Cranial Nerves: No  cranial nerve deficit.     Motor: No abnormal muscle tone.     Coordination: Coordination normal.     Gait: Gait normal.     Deep Tendon Reflexes: Reflexes are normal and symmetric. Reflexes normal.  Psychiatric:        Mood and Affect: Mood normal.        Cognition and Memory: Cognition and memory normal.           Assessment & Plan:   Problem List Items Addressed This Visit       Cardiovascular and Mediastinum   Essential hypertension   bp in fair control at this time  BP Readings from Last 1 Encounters:  02/02/23 132/68   No changes needed Most recent labs reviewed  Disc lifstyle change with low sodium diet and exercise  Plan to continue benazeprail hct 10-12.5 mg daily      Relevant Medications   benazepril-hydrochlorthiazide (LOTENSIN HCT) 10-12.5 MG tablet     Endocrine   Hypothyroidism   Hypothyroidism  Pt has no clinical changes No change in energy level/ hair or skin/ edema and no tremor Lab Results  Component Value Date   TSH 2.98 01/29/2023    Plan to continue levothyroxine 88 mcg daily      Relevant Medications   levothyroxine (SYNTHROID) 88 MCG tablet     Musculoskeletal and Integument   Eczema of hand   Mild -bilateral hands have dry skin Small papule on right 4th finger Recommend moisturizer Over the counter cortisone cream prn  Follow up if not improved Avoid hot water and harsh detergents         Genitourinary   BPH (benign prostatic hyperplasia)   No clinical changes Lab Results  Component Value Date   PSA 5.59 (H) 01/29/2023   PSA 3.88 01/23/2022   PSA 4.87 (H) 01/20/2021   Will send for urology note and also send psa to urologist   Will se        Other   Routine general medical examination at a health care facility - Primary   Reviewed health habits including diet and exercise and skin cancer prevention Reviewed appropriate screening tests for age  Also reviewed health mt list, fam hx and immunization status , as well  as social and family history   See HPI Labs reviewed and ordered Declines flu and covid vaccines  Psa is up- sent to urologist  Colonoscopy 2023 with 5 y recall Discussed fall prevention, supplements and exercise for bone density  PHQ 0 Health Maintenance  Topic Date Due   Flu Shot  05/07/2023*   Hepatitis C Screening  01/16/2024*   COVID-19 Vaccine (1 - 2024-25 season) 02/17/2025*   Medicare Annual Wellness Visit  02/02/2024   Colon Cancer Screening  09/23/2026   DTaP/Tdap/Td vaccine (4 - Td or Tdap) 02/01/2032   Pneumonia Vaccine  Completed   Zoster (Shingles) Vaccine  Completed   HPV Vaccine  Aged Out  *Topic was postponed. The date shown is not the original due date.         PSA elevation   Lab Results  Component Value Date   PSA 5.59 (H) 01/29/2023   PSA 3.88 01/23/2022   PSA 4.87 (H) 01/20/2021    Sent copy to urologist       Prostate cancer screening   Lab Results  Component Value Date   PSA 5.59 (H) 01/29/2023   PSA 3.88 01/23/2022   PSA 4.87 (H) 01/20/2021    History of elevation  Sent report to urologist       Hyperlipidemia   Disc goals for lipids and reasons to control them Rev last labs with pt Rev low sat fat diet in detail Overall stable LDL 135 HDL low  In light of family history we discussed cardiac ca score for CAD screening and handout given  He will let us know if he want so pursue this       Relevant Medications   benazepril-hydrochlorthiazide (LOTENSIN HCT) 10-12.5 MG tablet   Family history of colon cancer   Colonoscopy utd 09/2021 with 5 y recall      Elevated random blood glucose level   Lab Results  Component Value Date   HGBA1C 5.5 01/29/2023   Not in prediabetes range

## 2023-02-02 NOTE — Assessment & Plan Note (Signed)
Reviewed health habits including diet and exercise and skin cancer prevention Reviewed appropriate screening tests for age  Also reviewed health mt list, fam hx and immunization status , as well as social and family history   See HPI Labs reviewed and ordered Declines flu and covid vaccines  Psa is up- sent to urologist  Colonoscopy 2023 with 5 y recall Discussed fall prevention, supplements and exercise for bone density  PHQ 0 Health Maintenance  Topic Date Due   Flu Shot  05/07/2023*   Hepatitis C Screening  01/16/2024*   COVID-19 Vaccine (1 - 2024-25 season) 02/17/2025*   Medicare Annual Wellness Visit  02/02/2024   Colon Cancer Screening  09/23/2026   DTaP/Tdap/Td vaccine (4 - Td or Tdap) 02/01/2032   Pneumonia Vaccine  Completed   Zoster (Shingles) Vaccine  Completed   HPV Vaccine  Aged Out  *Topic was postponed. The date shown is not the original due date.

## 2023-02-02 NOTE — Patient Instructions (Addendum)
Moisturizers help dry skin and eczema  You can use an over the counter cortisone cream on it if it is itchy   I will send for your urology note I will send a copy of your psa to your urologist   Keep up the great exercise !   Here is a handout on a cardiac calcium scan  If you are interested -give [place] a call / and tell is if you prefer [facility] or  Usual cost is around [phone]

## 2023-02-02 NOTE — Assessment & Plan Note (Signed)
No clinical changes Lab Results  Component Value Date   PSA 5.59 (H) 01/29/2023   PSA 3.88 01/23/2022   PSA 4.87 (H) 01/20/2021   Will send for urology note and also send psa to urologist   Will se

## 2023-02-02 NOTE — Assessment & Plan Note (Signed)
Hypothyroidism  Pt has no clinical changes No change in energy level/ hair or skin/ edema and no tremor Lab Results  Component Value Date   TSH 2.98 01/29/2023    Plan to continue levothyroxine 88 mcg daily

## 2023-02-02 NOTE — Assessment & Plan Note (Signed)
Lab Results  Component Value Date   PSA 5.59 (H) 01/29/2023   PSA 3.88 01/23/2022   PSA 4.87 (H) 01/20/2021    History of elevation  Sent report to urologist

## 2023-02-02 NOTE — Assessment & Plan Note (Signed)
Lab Results  Component Value Date   HGBA1C 5.5 01/29/2023   Not in prediabetes range

## 2023-02-02 NOTE — Assessment & Plan Note (Signed)
Disc goals for lipids and reasons to control them Rev last labs with pt Rev low sat fat diet in detail Overall stable LDL 135 HDL low  In light of family history we discussed cardiac ca score for CAD screening and handout given  He will let us know if he want so pursue this

## 2023-02-02 NOTE — Assessment & Plan Note (Signed)
Lab Results  Component Value Date   PSA 5.59 (H) 01/29/2023   PSA 3.88 01/23/2022   PSA 4.87 (H) 01/20/2021    Sent copy to urologist

## 2023-02-05 ENCOUNTER — Encounter: Payer: Medicare Other | Admitting: Family Medicine

## 2023-03-08 DIAGNOSIS — M25522 Pain in left elbow: Secondary | ICD-10-CM | POA: Diagnosis not present

## 2023-03-08 DIAGNOSIS — K080 Exfoliation of teeth due to systemic causes: Secondary | ICD-10-CM | POA: Diagnosis not present

## 2023-04-03 ENCOUNTER — Encounter: Payer: Self-pay | Admitting: Family Medicine

## 2023-04-03 ENCOUNTER — Ambulatory Visit (INDEPENDENT_AMBULATORY_CARE_PROVIDER_SITE_OTHER): Payer: Medicare Other | Admitting: Family Medicine

## 2023-04-03 ENCOUNTER — Ambulatory Visit (INDEPENDENT_AMBULATORY_CARE_PROVIDER_SITE_OTHER)
Admission: RE | Admit: 2023-04-03 | Discharge: 2023-04-03 | Disposition: A | Discharge status: Home / Self Care | Payer: Medicare Other | Source: Ambulatory Visit | Attending: Family Medicine | Admitting: Family Medicine

## 2023-04-03 VITALS — BP 118/62 | HR 70 | Temp 97.6°F | Ht 66.5 in | Wt 184.0 lb

## 2023-04-03 DIAGNOSIS — R1031 Right lower quadrant pain: Secondary | ICD-10-CM | POA: Insufficient documentation

## 2023-04-03 DIAGNOSIS — M129 Arthropathy, unspecified: Secondary | ICD-10-CM | POA: Diagnosis not present

## 2023-04-03 NOTE — Progress Notes (Signed)
 Subjective:    Patient ID: Carlos Larson, male    DOB: 1956/01/23, 68 y.o.   MRN: [id number]  HPI  Wt Readings from Last 3 Encounters:  04/03/23 184 lb (83.5 kg)  02/02/23 184 lb 8 oz (83.7 kg)  01/16/23 181 lb (82.1 kg)   29.25 kg/m  Vitals:   04/03/23 1345  BP: 118/62  Pulse: 70  Temp: 97.6 F (36.4 C)  SpO2: 95%   Pt presents with pain in right lower abd/groin   Happens with position change Walking is ok (walked 1.25 mi this am)   Lifting leg and bending forward  Also if he jumps across a ditch   No n/v or fever No bowel changes   Had a bloated feeling day/gas  Took pepto and is better    He did walk [place] log over [place] and his right foot slipped off - that was end of Jan That is when this started   Not taking any pain medicine for this   He took meloxicam for elbow problem last week (that is better)   Xray today  DG HIP UNILAT WITH PELVIS 2-3 VIEWS RIGHT Result Date: 04/03/2023 CLINICAL DATA:  LBRD-DGpain in R groin-worse when flexing hip and flexing spine at the same time sharp in nature, possible injury a month ago EXAM: DG HIP (WITH OR WITHOUT PELVIS) 2-3V RIGHT COMPARISON:  None Available. FINDINGS: Hips are located. No evidence of pelvic fracture or sacral fracture. Dedicated view of the RIGHT hip demonstrates no femoral neck fracture. Minimal arthropathy. IMPRESSION: No evidence of RIGHT hip fracture. Electronically Signed   By: Genevive Bi M.D.   On: 04/03/2023 15:31     Patient Active Problem List   Diagnosis Date Noted   Right groin pain 04/03/2023   Eczema of hand 02/02/2023   BPH (benign prostatic hyperplasia) 01/31/2022   Elevated random blood glucose level 01/17/2016   PSA elevation 01/17/2016   Hyperlipidemia 01/12/2014   Hematuria 12/02/2012   Routine general medical examination at a health care facility 12/26/2010   Prostate cancer screening 12/26/2010   Family history of colon cancer 12/26/2010   Hypothyroidism  06/22/2009   Essential hypertension 06/22/2009   Past Medical History:  Diagnosis Date   Cataract    HTN (hypertension)    Hyperlipidemia    border line no medication updated 08/24/21   Hypothyroid    Pollen allergies    mild   Past Surgical History:  Procedure Laterality Date   COLONOSCOPY  01/12/2006, 02/10/2011   2007 - Normal (Dr. Bosie Clos) 2013 - mild diverticulosis [facility] Payor)   TONSILLECTOMY  1971   and adnoids   Social History   Tobacco Use   Smoking status: Former    Current packs/day: 0.00    Types: Cigarettes    Quit date: 02/07/1972    Years since quitting: 51.1    Passive exposure: Never   Smokeless tobacco: Never   Tobacco comments:    Smoked for 4 years  Vaping Use   Vaping status: Never Used  Substance Use Topics   Alcohol use: No    Alcohol/week: 0.0 standard drinks of alcohol   Drug use: No   Family History  Problem Relation Age of Onset   Heart disease Mother        valvular disease   Hypertension Mother    Colon polyps Father    Colon cancer Father 69   Hypertension Father    Heart disease Brother    Heart disease  Brother    Heart disease Maternal Grandmother    Coronary artery disease Maternal Grandmother    Prostate cancer Maternal Grandfather    Hypertension Paternal Grandfather    Diabetes Neg Hx    Crohn's disease Neg Hx    Esophageal cancer Neg Hx    Rectal cancer Neg Hx    Stomach cancer Neg Hx    No Known Allergies Current Outpatient Medications on File Prior to Visit  Medication Sig Dispense Refill   benazepril-hydrochlorthiazide (LOTENSIN HCT) 10-12.5 MG tablet Take 1 tablet by mouth daily. 90 tablet 3   Cholecalciferol (VITAMIN D) 50 MCG (2000 UT) tablet Take 2,000 Units by mouth daily.     fluticasone (FLONASE) 50 MCG/ACT nasal spray SPRAY 1 SPRAY INTO EACH NOSTRIL TWICE A DAY (Patient taking differently: Place 1 spray into both nostrils daily as needed.) 16 g 1   levothyroxine (SYNTHROID) 88 MCG tablet Take 1 tablet (88 mcg  total) by mouth daily before breakfast. 90 tablet 3   Omega-3 Fatty Acids (FISH OIL) 1200 MG CPDR Take 1 capsule by mouth.     sildenafil (VIAGRA) 100 MG tablet Take 50 mg by mouth daily as needed for erectile dysfunction.     vitamin C (ASCORBIC ACID) 500 MG tablet Take 500 mg by mouth daily.     Zinc 30 MG TABS Take 1 tablet by mouth daily.     meloxicam (MOBIC) 15 MG tablet Take 15 mg by mouth daily. (Patient not taking: Reported on 04/03/2023)     No current facility-administered medications on file prior to visit.    Review of Systems  Constitutional:  Negative for activity change, appetite change, fatigue, fever and unexpected weight change.  HENT:  Negative for congestion, rhinorrhea, sore throat and trouble swallowing.   Eyes:  Negative for pain, redness, itching and visual disturbance.  Respiratory:  Negative for cough, chest tightness, shortness of breath and wheezing.   Cardiovascular:  Negative for chest pain and palpitations.  Gastrointestinal:  Negative for abdominal pain, blood in stool, constipation, diarrhea and nausea.  Endocrine: Negative for cold intolerance, heat intolerance, polydipsia and polyuria.  Genitourinary:  Negative for difficulty urinating, dysuria, frequency and urgency.  Musculoskeletal:  Negative for arthralgias, joint swelling and myalgias.       Pain in right groin area with hip flexion  Skin:  Negative for pallor and rash.  Neurological:  Negative for dizziness, tremors, weakness, numbness and headaches.  Hematological:  Negative for adenopathy. Does not bruise/bleed easily.  Psychiatric/Behavioral:  Negative for decreased concentration and dysphoric mood. The patient is not nervous/anxious.        Objective:   Physical Exam Constitutional:      General: He is not in acute distress.    Appearance: Normal appearance. He is well-developed.     Comments: Overweight   HENT:     Head: Normocephalic and atraumatic.     Mouth/Throat:     Mouth:  Mucous membranes are moist.  Eyes:     General: No scleral icterus.    Conjunctiva/sclera: Conjunctivae normal.     Pupils: Pupils are equal, round, and reactive to light.  Neck:     Thyroid: No thyromegaly.     Vascular: No carotid bruit or JVD.  Cardiovascular:     Rate and Rhythm: Normal rate and regular rhythm.     Heart sounds: Normal heart sounds.     No gallop.  Pulmonary:     Effort: Pulmonary effort is normal. No respiratory distress.  Breath sounds: Normal breath sounds. No wheezing or rales.  Abdominal:     General: Abdomen is protuberant. Bowel sounds are normal. There is no distension or abdominal bruit.     Palpations: Abdomen is soft. There is no hepatomegaly, splenomegaly, mass or pulsatile mass.     Tenderness: There is no abdominal tenderness. There is no right CVA tenderness, left CVA tenderness, guarding or rebound. Negative signs include Murphy's sign and psoas sign.     Hernia: No hernia is present.  Musculoskeletal:     Cervical back: Normal range of motion and neck supple.     Right hip: No deformity, tenderness, bony tenderness or crepitus. Normal range of motion. Normal strength.     Right lower leg: No edema.     Left lower leg: No edema.     Comments: Pain occurs when right hip is flexed over 90 degrees when also bending forward (spine flexion)  Some discomfort with full ext rotation  No bursal tenderness   Normal gait   Lymphadenopathy:     Cervical: No cervical adenopathy.  Skin:    General: Skin is warm and dry.     Coloration: Skin is not pale.     Findings: No rash.  Neurological:     Mental Status: He is alert.     Coordination: Coordination normal.     Deep Tendon Reflexes: Reflexes are normal and symmetric. Reflexes normal.  Psychiatric:        Mood and Affect: Mood normal.           Assessment & Plan:   Problem List Items Addressed This Visit       Other   Right groin pain - Primary   First occurred after slipping on a  log about a month ago  Is a hunter/ very active  Bothers him to flex hip and spine at the same time  Reassuring exam today  No abd/pelvic tenderness/ no palpable hernia and no pain with valsalva Also normal gait  No meds (did take meloxicam for elbow last week)   Suspect possible groin muscle injury vs early hip oa  Xray hip now notes minimal arthropathy and no fracture    Encouraged use of gentle warm compress prn  Avoid painful activities  Consider sport med input        Relevant Orders   DG HIP UNILAT WITH PELVIS 2-3 VIEWS RIGHT (Completed)

## 2023-04-03 NOTE — Patient Instructions (Addendum)
 Watch for swelling or a lump in the affected area   Use gentle heat if helpful   Xray of hip now  We will reach out with the result  Then will make a plan  Gentle warm compress for 10 minutes is ok when able    Avoid activities that hurt if you can

## 2023-04-03 NOTE — Assessment & Plan Note (Addendum)
 First occurred after slipping on a log about a month ago  Is a hunter/ very active  Bothers him to flex hip and spine at the same time  Reassuring exam today  No abd/pelvic tenderness/ no palpable hernia and no pain with valsalva Also normal gait  No meds (did take meloxicam for elbow last week)   Suspect possible groin muscle injury vs early hip oa  Xray hip now notes minimal arthropathy and no fracture    Encouraged use of gentle warm compress prn  Avoid painful activities  Consider sport med input

## 2023-09-07 DIAGNOSIS — K080 Exfoliation of teeth due to systemic causes: Secondary | ICD-10-CM | POA: Diagnosis not present

## 2023-09-11 DIAGNOSIS — R972 Elevated prostate specific antigen [PSA]: Secondary | ICD-10-CM | POA: Diagnosis not present

## 2023-09-17 ENCOUNTER — Other Ambulatory Visit: Payer: Self-pay | Admitting: Urology

## 2023-09-17 DIAGNOSIS — R972 Elevated prostate specific antigen [PSA]: Secondary | ICD-10-CM

## 2023-09-17 DIAGNOSIS — N528 Other male erectile dysfunction: Secondary | ICD-10-CM | POA: Diagnosis not present

## 2023-09-17 LAB — PSA: PSA: 5.7

## 2023-09-19 ENCOUNTER — Encounter: Payer: Self-pay | Admitting: Urology

## 2023-09-26 DIAGNOSIS — M180 Bilateral primary osteoarthritis of first carpometacarpal joints: Secondary | ICD-10-CM | POA: Diagnosis not present

## 2023-09-26 DIAGNOSIS — M65332 Trigger finger, left middle finger: Secondary | ICD-10-CM | POA: Diagnosis not present

## 2023-09-26 DIAGNOSIS — M7702 Medial epicondylitis, left elbow: Secondary | ICD-10-CM | POA: Diagnosis not present

## 2023-10-17 ENCOUNTER — Ambulatory Visit
Admission: RE | Admit: 2023-10-17 | Discharge: 2023-10-17 | Disposition: A | Discharge status: Home / Self Care | Source: Ambulatory Visit | Attending: Urology | Admitting: Urology

## 2023-10-17 DIAGNOSIS — R972 Elevated prostate specific antigen [PSA]: Secondary | ICD-10-CM | POA: Diagnosis not present

## 2023-10-17 MED ORDER — GADOPICLENOL 0.5 MMOL/ML IV SOLN
8.0000 mL | Freq: Once | INTRAVENOUS | Status: AC | PRN
  Administered 2023-10-17: 8 mL via INTRAVENOUS

## 2024-01-07 DIAGNOSIS — H25013 Cortical age-related cataract, bilateral: Secondary | ICD-10-CM | POA: Diagnosis not present

## 2024-01-07 DIAGNOSIS — H0288B Meibomian gland dysfunction left eye, upper and lower eyelids: Secondary | ICD-10-CM | POA: Diagnosis not present

## 2024-01-07 DIAGNOSIS — H2513 Age-related nuclear cataract, bilateral: Secondary | ICD-10-CM | POA: Diagnosis not present

## 2024-01-07 DIAGNOSIS — H0288A Meibomian gland dysfunction right eye, upper and lower eyelids: Secondary | ICD-10-CM | POA: Diagnosis not present

## 2024-01-17 ENCOUNTER — Ambulatory Visit (INDEPENDENT_AMBULATORY_CARE_PROVIDER_SITE_OTHER): Admitting: Family Medicine

## 2024-01-17 DIAGNOSIS — Z0000 Encounter for general adult medical examination without abnormal findings: Secondary | ICD-10-CM | POA: Diagnosis not present

## 2024-01-17 NOTE — Patient Instructions (Addendum)
 I really enjoyed getting to talk with you today! I am available on Tuesdays and Thursdays for virtual visits if you have any questions or concerns, or if I can be of any further assistance.   CHECKLIST FROM ANNUAL WELLNESS VISIT:  -Follow up (please call to schedule if not scheduled after visit):   -yearly for annual wellness visit with primary care office  Here is a list of your preventive care/health maintenance measures and the plan for each if any are due:  PLAN For any measures below that may be due:    1. Can get flu vaccine at the office or at the pharmacy.   Health Maintenance  Topic Date Due   Hepatitis C Screening  Never done   Influenza Vaccine  09/07/2023   Medicare Annual Wellness (AWV)  02/02/2024   COVID-19 Vaccine (1 - 2025-26 season) 02/17/2025 (Originally 10/08/2023)   Colonoscopy  09/23/2026   DTaP/Tdap/Td (4 - Td or Tdap) 02/01/2032   Pneumococcal Vaccine: 50+ Years  Completed   Zoster Vaccines- Shingrix  Completed   Meningococcal B Vaccine  Aged Out    -See a dentist at least yearly  -Get your eyes checked and then per your eye specialist's recommendations  -Other issues addressed today:   2, Please bring a copy of your advanced directives to Dr. Randeen.    -I have included below further information regarding a healthy whole foods based diet, physical activity guidelines for adults, stress management and opportunities for social connections. I hope you find this information useful.   -----------------------------------------------------------------------------------------------------------------------------------------------------------------------------------------------------------------------------------------------------------    NUTRITION: -eat real food: lots of colorful vegetables (half the plate) and fruits -5-7 servings of vegetables and fruits per day (fresh or steamed is best), exp. 2 servings of vegetables with lunch and dinner and 2 servings  of fruit per day. Berries and greens such as kale and collards are great choices.  -consume on a regular basis:  fresh fruits, fresh veggies, fish, nuts, seeds, healthy oils (such as olive oil, avocado oil), whole grains (make sure for bread/pasta/crackers/etc., that the first ingredient on label contains the word whole), legumes. -can eat small amounts of dairy and lean meat (no larger than the palm of your hand), but avoid processed meats such as ham, bacon, lunch meat, etc. -drink water -try to avoid fast food and pre-packaged foods, processed meat, ultra processed foods/beverages (donuts, candy, etc.) -most experts advise limiting sodium to < 2300mg  per day, should limit further is any chronic conditions such as high blood pressure, heart disease, diabetes, etc. The American Heart Association advised that < 1500mg  is is ideal -try to avoid foods/beverages that contain any ingredients with names you do not recognize  -try to avoid foods/beverages  with added sugar or sweeteners/sweets  -try to avoid sweet drinks (including diet drinks): soda, juice, Gatorade, sweet tea, power drinks, diet drinks -try to avoid white rice, white bread, pasta (unless whole grain)  EXERCISE GUIDELINES FOR ADULTS: -if you wish to increase your physical activity, do so gradually and with the approval of your doctor -STOP and seek medical care immediately if you have any chest pain, chest discomfort or trouble breathing when starting or increasing exercise  -move and stretch your body, legs, feet and arms when sitting for long periods -Physical activity guidelines for optimal health in adults: -get at least 150 minutes per week of moderate exercise (can talk, but not sing); this is about 20-30 minutes of sustained activity 5-7 days per week or two 10-15 minute episodes of  sustained activity 5-7 days per week -do some muscle building/resistance training/strength training at least 2 days per week  -balance exercises 3+  days per week:   Stand somewhere where you have something sturdy to hold onto if you lose balance    1) lift up on toes, then back down, start with 5x per day and work up to 20x   2) stand and lift one leg straight out to the side so that foot is a few inches of the floor, start with 5x each side and work up to 20x each side   3) stand on one foot, start with 5 seconds each side and work up to 20 seconds on each side  If you need ideas or help with getting more active:  -Silver sneakers https://tools.silversneakers.com  -Walk with a Doc: Http://www.duncan-williams.com/  -try to include resistance (weight lifting/strength building) and balance exercises twice per week: or the following link for ideas: http://castillo-powell.com/  buyducts.dk  STRESS MANAGEMENT: -can try meditating, or just sitting quietly with deep breathing while intentionally relaxing all parts of your body for 5 minutes daily -if you need further help with stress, anxiety or depression please follow up with your primary doctor or contact the wonderful folks at [facility]: [phone]  SOCIAL CONNECTIONS: -options in [place] if you wish to engage in more social and exercise related activities:  -Silver sneakers https://tools.silversneakers.com  -Walk with a Doc: Http://www.duncan-williams.com/  -Check out the [facility] Active Adults 50+ section on the [place] of [employer] companies (hiking clubs, book clubs, cards and games, chess, exercise classes, aquatic classes and much more) - see the website for details: https://www.[place].gov/departments/parks-recreation/active-adults50  -YouTube has lots of exercise videos for different ages and abilities as well  -[facility] (a variety of indoor and outdoor inperson activities for adults). [phone]. [address]  -Virtual Online Classes (a  variety of topics): see seniorplanet.org or call [phone]  -consider volunteering at a school, hospice center, church, senior center or elsewhere

## 2024-01-17 NOTE — Progress Notes (Signed)
 ----------------------------------------------------------------------------------------------------------------------------------------------------------------------------------------------------------------------  Because this visit was a virtual/telehealth visit, some criteria may be missing or patient reported. Any vitals not documented were not able to be obtained and vitals that have been documented are patient reported.    MEDICARE ANNUAL PREVENTIVE CARE VISIT WITH PROVIDER (Welcome to Medicare, initial annual wellness or annual wellness exam)  Virtual Visit via Phone Note  I connected with Carlos Larson on 01/17/2024  by phone and verified that I am speaking with the correct person using two identifiers.  Location patient: home Location provider:work or home office Persons participating in the virtual visit: patient, provider  Concerns and/or follow up today: detailed intake and health/risks assessment completed on flow sheets and below- please see for details.   How often do you have a drink containing alcohol?n How many drinks containing alcohol do you have on a typical day when you are drinking?na How often do you have six or more drinks on one occasion?na Have you ever smoked?y Quit date if applicable? Only as a teenager - quit at age a62 How many packs a day do/did you smoke? n Do you use smokeless tobacco?n Do you use an illicit drugs?n Do you feel safe at home?y Last dentist visit? Goes twice a year Last eye Exam and location?[facility], just went last week   See HM section in Epic for other details of completed HM.    ROS: negative for report of fevers, unintentional weight loss, vision changes, vision loss, hearing loss or change, chest pain, sob, hemoptysis, melena, hematochezia, hematuria,bleeding or bruising  Patient-completed extensive health risk assessment - reviewed and discussed with the patient: See Health Risk Assessment completed with patient prior  to the visit either above or in recent phone note. This was reviewed in detailed with the patient today and appropriate recommendations, orders and referrals were placed as needed per Summary below and patient instructions.   Review of Medical History: -PMH, PSH, Family History and current specialty and care providers reviewed and updated and listed below   Patient Care Team: Tower, Laine LABOR, MD as PCP - [facility]   Past Medical History:  Diagnosis Date   Cataract    HTN (hypertension)    Hyperlipidemia    border line no medication updated 08/24/21   Hypothyroid    Pollen allergies    mild    Past Surgical History:  Procedure Laterality Date   COLONOSCOPY  01/12/2006, 02/10/2011   2007 - Normal (Dr. Dianna) 2013 - mild diverticulosis [place])   TONSILLECTOMY  1971   and adnoids    Social History   Socioeconomic History   Marital status: Single    Spouse name: Not on file   Number of children: Not on file   Years of education: Not on file   Highest education level: Not on file  Occupational History   Occupation: Lineman    Employer: [employer]  Tobacco Use   Smoking status: Former    Current packs/day: 0.00    Types: Cigarettes    Quit date: 02/07/1972    Years since quitting: 51.9    Passive exposure: Never   Smokeless tobacco: Never   Tobacco comments:    Smoked for 4 years  Vaping Use   Vaping status: Never Used  Substance and Sexual Activity   Alcohol use: No    Alcohol/week: 0.0 standard drinks of alcohol   Drug use: No   Sexual activity: Not on file  Other Topics Concern  Not on file  Social History Narrative   Married      Regular exercise (lifts weights)      Lineman for [employer]   Social Drivers of Health   Tobacco Use: Medium Risk (04/03/2023)   Patient History    Smoking Tobacco Use: Former    Smokeless Tobacco Use: Never    Passive Exposure: Never  Physicist, Medical Strain: Low Risk (01/16/2023)   Overall  Financial Resource Strain (CARDIA)    Difficulty of Paying Living Expenses: Not hard at all  Food Insecurity: No Food Insecurity (01/16/2023)   Hunger Vital Sign    Worried About Running Out of Food in the Last Year: Never true    Ran Out of Food in the Last Year: Never true  Transportation Needs: No Transportation Needs (01/16/2023)   PRAPARE - Administrator, Civil Service (Medical): No    Lack of Transportation (Non-Medical): No  Physical Activity: Sufficiently Active (01/17/2024)   Exercise Vital Sign    Days of Exercise per Week: 6 days    Minutes of Exercise per Session: 30 min  Stress: No Stress Concern Present (01/17/2024)   [employer] of Occupational Health - Occupational Stress Questionnaire    Feeling of Stress: Not at all  Social Connections: Unknown (01/17/2024)   Social Connection and Isolation Panel    Frequency of Communication with Friends and Family: More than three times a week    Frequency of Social Gatherings with Friends and Family: More than three times a week    Attends Religious Services: More than 4 times per year    Active Member of [employer] or Organizations: No    Attends Banker Meetings: Never    Marital Status: Not on file  Intimate Partner Violence: Not At Risk (01/16/2023)   Humiliation, Afraid, Rape, and Kick questionnaire    Fear of Current or Ex-Partner: No    Emotionally Abused: No    Physically Abused: No    Sexually Abused: No  Depression (PHQ2-9): Low Risk (01/17/2024)   Depression (PHQ2-9)    PHQ-2 Score: 0  Alcohol Screen: Low Risk (01/16/2023)   Alcohol Screen    Last Alcohol Screening Score (AUDIT): 0  Housing: Low Risk (01/16/2023)   Housing    Last Housing Risk Score: 0  Utilities: Not At Risk (01/16/2023)   AHC Utilities    Threatened with loss of utilities: No  Health Literacy: Adequate Health Literacy (01/16/2023)   B1300 Health Literacy    Frequency of need for help with medical instructions:  Never    Family History  Problem Relation Age of Onset   Heart disease Mother        valvular disease   Hypertension Mother    Colon polyps Father    Colon cancer Father 68   Hypertension Father    Heart disease Brother    Heart disease Brother    Heart disease Maternal Grandmother    Coronary artery disease Maternal Grandmother    Prostate cancer Maternal Grandfather    Hypertension Paternal Grandfather    Diabetes Neg Hx    Crohn's disease Neg Hx    Esophageal cancer Neg Hx    Rectal cancer Neg Hx    Stomach cancer Neg Hx     Medications Ordered Prior to Encounter[1]  Allergies[2]     Physical Exam Vitals requested from patient and listed below if patient had equipment and was able to obtain at home for this virtual visit: There  were no vitals filed for this visit. Estimated body mass index is 29.25 kg/m as calculated from the following:   Height as of 04/03/23: 5' 6.5 (1.689 m).   Weight as of 04/03/23: 184 lb (83.5 kg).  EKG (optional): deferred due to virtual visit  GENERAL: alert, oriented, no acute distress detected; full vision exam deferred due to pandemic and/or virtual encounter  PSYCH/NEURO: pleasant and cooperative, no obvious depression or anxiety, speech and thought processing grossly intact, Cognitive function grossly intact  Flowsheet Row Office Visit [facility]/03/20[facility] in [facility] [place]  PHQ-9 Total Score 0        01/17/2024    3:18 PM 04/03/2023    1:51 PM 01/16/2023   10:46 AM 02/01/2022    8:23 AM 01/27/2021    8:32 AM  Depression screen PHQ 2/9  Decreased Interest 0 0 0 0 0  Down, Depressed, Hopeless 0 0 0 0 0  PHQ - 2 Score 0 0 0 0 0  Altered sleeping  0     Tired, decreased energy  0     Change in appetite  0     Feeling bad or failure about yourself   0     Trouble concentrating  0     Moving slowly or fidgety/restless  0     Suicidal thoughts  0     PHQ-9 Score  0      Difficult doing work/chores   Not difficult at all        Data saved with a previous flowsheet row definition       02/01/2022    8:27 AM 01/16/2023   10:43 AM 04/03/2023    1:50 PM 01/17/2024    3:10 PM 01/17/2024    3:18 PM  Fall Risk  Falls in the past year? 0 1 0 0   Was there an injury with Fall? 0  0  0     Fall Risk Category Calculator 0 1 0    Fall Risk Category (Retired) Low       (RETIRED) Patient Fall Risk Level Low fall risk       Patient at Risk for Falls Due to  No Fall Risks No Fall Risks No Fall Risks No Fall Risks  Fall risk Follow up Falls evaluation completed  Education provided;Falls prevention discussed Falls evaluation completed Falls evaluation completed Falls evaluation completed     Data saved with a previous flowsheet row definition     SUMMARY AND PLAN:  Encounter for Medicare annual wellness exam    Discussed applicable health maintenance/preventive health measures and advised and referred or ordered per patient preferences: -discussed  vaccine recs/risks, advised where can get and to let us  know if does so that we can update chart -discussed hep C screening, declined for now Health Maintenance  Topic Date Due   Hepatitis C Screening  Never done   Influenza Vaccine  09/07/2023   COVID-19 Vaccine (1 - 2025-26 season) 02/17/2025 (Originally 10/08/2023)   Medicare Annual Wellness (AWV)  01/16/2025   Colonoscopy  09/23/2026   DTaP/Tdap/Td (4 - Td or Tdap) 02/01/2032   Pneumococcal Vaccine: 50+ Years  Completed   Zoster Vaccines- Shingrix  Completed   Meningococcal B Vaccine  Aged Out     Education and counseling on the following was provided based on the above review of health and a plan/checklist for the patient, along with additional information discussed, was provided for the patient in the patient  instructions :  -requested advanced directives -Advised and counseled on a healthy lifestyle - including the importance of a healthy diet, regular physical activity, social  connections and stress management. -Reviewed patient's current diet. Advised and counseled on a whole foods based healthy diet. A summary of a healthy diet was provided in the Patient Instructions.  -reviewed patient's current physical activity level and discussed exercise guidelines for adults. Discussed community resources and ideas for safe exercise at home to assist in meeting exercise guideline recommendations in a safe and healthy way.  -Advise yearly dental visits at minimum and regular eye exams  Follow up: see patient instructions   Patient Instructions  I really enjoyed getting to talk with you today! I am available on Tuesdays and Thursdays for virtual visits if you have any questions or concerns, or if I can be of any further assistance.   CHECKLIST FROM ANNUAL WELLNESS VISIT:  -Follow up (please call to schedule if not scheduled after visit):   -yearly for annual wellness visit with primary care office  Here is a list of your preventive care/health maintenance measures and the plan for each if any are due:  PLAN For any measures below that may be due:    1. Can get flu vaccine at the office or at the pharmacy.   Health Maintenance  Topic Date Due   Hepatitis C Screening  Never done   Influenza Vaccine  09/07/2023   Medicare Annual Wellness (AWV)  02/02/2024   COVID-19 Vaccine (1 - 2025-26 season) 02/17/2025 (Originally 10/08/2023)   Colonoscopy  09/23/2026   DTaP/Tdap/Td (4 - Td or Tdap) 02/01/2032   Pneumococcal Vaccine: 50+ Years  Completed   Zoster Vaccines- Shingrix  Completed   Meningococcal B Vaccine  Aged Out    -See a dentist at least yearly  -Get your eyes checked and then per your eye specialist's recommendations  -Other issues addressed today:   2, Please bring a copy of your advanced directives to Dr. Randeen.    -I have included below further information regarding a healthy whole foods based diet, physical activity guidelines for adults, stress  management and opportunities for social connections. I hope you find this information useful.   -----------------------------------------------------------------------------------------------------------------------------------------------------------------------------------------------------------------------------------------------------------    NUTRITION: -eat real food: lots of colorful vegetables (half the plate) and fruits -5-7 servings of vegetables and fruits per day (fresh or steamed is best), exp. 2 servings of vegetables with lunch and dinner and 2 servings of fruit per day. Berries and greens such as kale and collards are great choices.  -consume on a regular basis:  fresh fruits, fresh veggies, fish, nuts, seeds, healthy oils (such as olive oil, avocado oil), whole grains (make sure for bread/pasta/crackers/etc., that the first ingredient on label contains the word whole), legumes. -can eat small amounts of dairy and lean meat (no larger than the palm of your hand), but avoid processed meats such as ham, bacon, lunch meat, etc. -drink water -try to avoid fast food and pre-packaged foods, processed meat, ultra processed foods/beverages (donuts, candy, etc.) -most experts advise limiting sodium to < 2300mg  per day, should limit further is any chronic conditions such as high blood pressure, heart disease, diabetes, etc. The American Heart Association advised that < 1500mg  is is ideal -try to avoid foods/beverages that contain any ingredients with names you do not recognize  -try to avoid foods/beverages  with added sugar or sweeteners/sweets  -try to avoid sweet drinks (including diet drinks): soda, juice, Gatorade, sweet tea, power drinks,  diet drinks -try to avoid white rice, white bread, pasta (unless whole grain)  EXERCISE GUIDELINES FOR ADULTS: -if you wish to increase your physical activity, do so gradually and with the approval of your doctor -STOP and seek medical care  immediately if you have any chest pain, chest discomfort or trouble breathing when starting or increasing exercise  -move and stretch your body, legs, feet and arms when sitting for long periods -Physical activity guidelines for optimal health in adults: -get at least 150 minutes per week of moderate exercise (can talk, but not sing); this is about 20-30 minutes of sustained activity 5-7 days per week or two 10-15 minute episodes of sustained activity 5-7 days per week -do some muscle building/resistance training/strength training at least 2 days per week  -balance exercises 3+ days per week:   Stand somewhere where you have something sturdy to hold onto if you lose balance    1) lift up on toes, then back down, start with 5x per day and work up to 20x   2) stand and lift one leg straight out to the side so that foot is a few inches of the floor, start with 5x each side and work up to 20x each side   3) stand on one foot, start with 5 seconds each side and work up to 20 seconds on each side  If you need ideas or help with getting more active:  -Silver sneakers https://tools.silversneakers.com  -Walk with a Doc: Http://www.duncan-williams.com/  -try to include resistance (weight lifting/strength building) and balance exercises twice per week: or the following link for ideas: http://castillo-powell.com/  buyducts.dk  STRESS MANAGEMENT: -can try meditating, or just sitting quietly with deep breathing while intentionally relaxing all parts of your body for 5 minutes daily -if you need further help with stress, anxiety or depression please follow up with your primary doctor or contact the wonderful folks at [facility]: [phone]  SOCIAL CONNECTIONS: -options in [place] if you wish to engage in more social and exercise related activities:  -Silver  sneakers https://tools.silversneakers.com  -Walk with a Doc: Http://www.duncan-williams.com/  -Check out the [facility] Active Adults 50+ section on the [place] of [employer] (hiking clubs, book clubs, cards and games, chess, exercise classes, aquatic classes and much more) - see the website for details: https://www.[place].gov/departments/parks-recreation/active-adults50  -YouTube has lots of exercise videos for different ages and abilities as well  -[facility] (a variety of indoor and outdoor inperson activities for adults). [phone]. [address]  -Virtual Online Classes (a variety of topics): see seniorplanet.org or call [phone]  -consider volunteering at a school, hospice center, church, senior center or elsewhere            Chiquita JONELLE Cramp, DO     [1]  Current Outpatient Medications on File Prior to Visit  Medication Sig Dispense Refill   benazepril -hydrochlorthiazide (LOTENSIN  HCT) 10-12.5 MG tablet Take 1 tablet by mouth daily. 90 tablet 3   Cholecalciferol (VITAMIN D) 50 MCG (2000 UT) tablet Take 2,000 Units by mouth daily.     fluticasone  (FLONASE ) 50 MCG/ACT nasal spray SPRAY 1 SPRAY INTO EACH NOSTRIL TWICE A DAY (Patient taking differently: Place 1 spray into both nostrils daily as needed.) 16 g 1   levothyroxine  (SYNTHROID ) 88 MCG tablet Take 1 tablet (88 mcg total) by mouth daily before breakfast. 90 tablet 3   meloxicam  (MOBIC ) 15 MG tablet Take 15 mg by mouth daily. (Patient not taking: Reported on 04/03/2023)  Omega-3 Fatty Acids (FISH OIL) 1200 MG CPDR Take 1 capsule by mouth.     sildenafil (VIAGRA) 100 MG tablet Take 50 mg by mouth daily as needed for erectile dysfunction.     vitamin C (ASCORBIC ACID) 500 MG tablet Take 500 mg by mouth daily.     Zinc 30 MG TABS Take 1 tablet by mouth daily.     No current facility-administered medications on file prior to visit.  [2] No Known Allergies

## 2024-01-22 ENCOUNTER — Other Ambulatory Visit: Payer: Self-pay | Admitting: Family Medicine

## 2024-01-22 MED ORDER — LEVOTHYROXINE SODIUM 88 MCG PO TABS: 88.0000 ug | ORAL_TABLET | Freq: Every day | ORAL | 0 refills | Status: DC

## 2024-01-22 NOTE — Telephone Encounter (Signed)
 Copied from CRM #[id number]. Topic: Clinical - Medication Question >> Jan 22, 2024 10:57 AM Rea ORN wrote: Reason for CRM: Pt stated he will run out of levothyroxine  (SYNTHROID ) 88 MCG tablet before his appt with PCP on 02/04/24. He is asking if 10 tablets can be called in prior to his appt? He will have his labs done 01/28/24 to check his thyroid  levels.  Please call back to advise [phone]

## 2024-01-27 ENCOUNTER — Telehealth: Payer: Self-pay | Admitting: Family Medicine

## 2024-01-27 DIAGNOSIS — I10 Essential (primary) hypertension: Secondary | ICD-10-CM

## 2024-01-27 DIAGNOSIS — N400 Enlarged prostate without lower urinary tract symptoms: Secondary | ICD-10-CM

## 2024-01-27 DIAGNOSIS — R739 Hyperglycemia, unspecified: Secondary | ICD-10-CM

## 2024-01-27 DIAGNOSIS — E039 Hypothyroidism, unspecified: Secondary | ICD-10-CM

## 2024-01-27 DIAGNOSIS — E7800 Pure hypercholesterolemia, unspecified: Secondary | ICD-10-CM

## 2024-01-27 DIAGNOSIS — R972 Elevated prostate specific antigen [PSA]: Secondary | ICD-10-CM

## 2024-01-27 NOTE — Telephone Encounter (Signed)
-----   Message from Carlos Larson sent at 01/11/2024  9:43 AM EST ----- Regarding: Lab Mon 01/28/24 Hello,  Patient is coming in for CPE labs. Can we get orders please.   Thanks

## 2024-01-28 ENCOUNTER — Ambulatory Visit: Payer: Self-pay | Admitting: Family Medicine

## 2024-01-28 ENCOUNTER — Other Ambulatory Visit: Payer: Medicare Other

## 2024-01-28 DIAGNOSIS — E7800 Pure hypercholesterolemia, unspecified: Secondary | ICD-10-CM | POA: Diagnosis not present

## 2024-01-28 DIAGNOSIS — R739 Hyperglycemia, unspecified: Secondary | ICD-10-CM

## 2024-01-28 DIAGNOSIS — N400 Enlarged prostate without lower urinary tract symptoms: Secondary | ICD-10-CM

## 2024-01-28 DIAGNOSIS — R972 Elevated prostate specific antigen [PSA]: Secondary | ICD-10-CM

## 2024-01-28 DIAGNOSIS — I10 Essential (primary) hypertension: Secondary | ICD-10-CM | POA: Diagnosis not present

## 2024-01-28 DIAGNOSIS — E039 Hypothyroidism, unspecified: Secondary | ICD-10-CM | POA: Diagnosis not present

## 2024-01-28 LAB — CBC WITH DIFFERENTIAL/PLATELET
Basophils Absolute: 0 K/uL (ref 0.0–0.1)
Basophils Relative: 0.5 % (ref 0.0–3.0)
Eosinophils Absolute: 0.1 K/uL (ref 0.0–0.7)
Eosinophils Relative: 1.8 % (ref 0.0–5.0)
HCT: 47.4 % (ref 39.0–52.0)
Hemoglobin: 16.5 g/dL (ref 13.0–17.0)
Lymphocytes Relative: 21.2 % (ref 12.0–46.0)
Lymphs Abs: 1.3 K/uL (ref 0.7–4.0)
MCHC: 34.9 g/dL (ref 30.0–36.0)
MCV: 93.2 fl (ref 78.0–100.0)
Monocytes Absolute: 0.4 K/uL (ref 0.1–1.0)
Monocytes Relative: 6.1 % (ref 3.0–12.0)
Neutro Abs: 4.3 K/uL (ref 1.4–7.7)
Neutrophils Relative %: 70.4 % (ref 43.0–77.0)
Platelets: 193 K/uL (ref 150.0–400.0)
RBC: 5.08 Mil/uL (ref 4.22–5.81)
RDW: 13.8 % (ref 11.5–15.5)
WBC: 6.2 K/uL (ref 4.0–10.5)

## 2024-01-28 LAB — COMPREHENSIVE METABOLIC PANEL WITH GFR
ALT: 20 U/L (ref 3–53)
AST: 21 U/L (ref 5–37)
Albumin: 4.6 g/dL (ref 3.5–5.2)
Alkaline Phosphatase: 68 U/L (ref 39–117)
BUN: 12 mg/dL (ref 6–23)
CO2: 32 meq/L (ref 19–32)
Calcium: 9.2 mg/dL (ref 8.4–10.5)
Chloride: 100 meq/L (ref 96–112)
Creatinine, Ser: 1 mg/dL (ref 0.40–1.50)
GFR: 77.07 mL/min
Glucose, Bld: 93 mg/dL (ref 70–99)
Potassium: 3.8 meq/L (ref 3.5–5.1)
Sodium: 140 meq/L (ref 135–145)
Total Bilirubin: 1.1 mg/dL (ref 0.2–1.2)
Total Protein: 6.6 g/dL (ref 6.0–8.3)

## 2024-01-28 LAB — LIPID PANEL
Cholesterol: 191 mg/dL (ref 28–200)
HDL: 34.2 mg/dL — ABNORMAL LOW
LDL Cholesterol: 139 mg/dL — ABNORMAL HIGH (ref 10–99)
NonHDL: 156.89
Total CHOL/HDL Ratio: 6
Triglycerides: 88 mg/dL (ref 10.0–149.0)
VLDL: 17.6 mg/dL (ref 0.0–40.0)

## 2024-01-28 LAB — PSA, MEDICARE: PSA: 4.25 ng/mL — ABNORMAL HIGH (ref 0.10–4.00)

## 2024-01-28 LAB — HEMOGLOBIN A1C: Hgb A1c MFr Bld: 5.3 % (ref 4.6–6.5)

## 2024-01-28 LAB — TSH: TSH: 4.97 u[IU]/mL (ref 0.35–5.50)

## 2024-02-04 ENCOUNTER — Encounter: Payer: Self-pay | Admitting: Family Medicine

## 2024-02-04 ENCOUNTER — Ambulatory Visit: Payer: Medicare Other | Admitting: Family Medicine

## 2024-02-04 VITALS — BP 132/74 | HR 63 | Temp 98.2°F | Ht 66.5 in | Wt 185.0 lb

## 2024-02-04 DIAGNOSIS — E039 Hypothyroidism, unspecified: Secondary | ICD-10-CM

## 2024-02-04 DIAGNOSIS — E7800 Pure hypercholesterolemia, unspecified: Secondary | ICD-10-CM | POA: Diagnosis not present

## 2024-02-04 DIAGNOSIS — N400 Enlarged prostate without lower urinary tract symptoms: Secondary | ICD-10-CM | POA: Diagnosis not present

## 2024-02-04 DIAGNOSIS — R972 Elevated prostate specific antigen [PSA]: Secondary | ICD-10-CM | POA: Diagnosis not present

## 2024-02-04 DIAGNOSIS — Z125 Encounter for screening for malignant neoplasm of prostate: Secondary | ICD-10-CM

## 2024-02-04 DIAGNOSIS — I10 Essential (primary) hypertension: Secondary | ICD-10-CM | POA: Diagnosis not present

## 2024-02-04 DIAGNOSIS — Z0000 Encounter for general adult medical examination without abnormal findings: Secondary | ICD-10-CM | POA: Diagnosis not present

## 2024-02-04 DIAGNOSIS — R739 Hyperglycemia, unspecified: Secondary | ICD-10-CM | POA: Diagnosis not present

## 2024-02-04 DIAGNOSIS — Z800 Family history of malignant neoplasm of digestive organs: Secondary | ICD-10-CM | POA: Diagnosis not present

## 2024-02-04 MED ORDER — LEVOTHYROXINE SODIUM 88 MCG PO TABS: 88.0000 ug | ORAL_TABLET | Freq: Every day | ORAL | 3 refills | Status: AC

## 2024-02-04 MED ORDER — BENAZEPRIL-HYDROCHLOROTHIAZIDE 10-12.5 MG PO TABS: 1.0000 | ORAL_TABLET | Freq: Every day | ORAL | 3 refills | Status: AC

## 2024-02-04 NOTE — Assessment & Plan Note (Signed)
 Continues urol care Lab Results  Component Value Date   PSA 4.25 (H) 01/28/2024   PSA 5.7 09/17/2023   PSA 5.59 (H) 01/29/2023   Reassuring mri prostate this year  reassu

## 2024-02-04 NOTE — Patient Instructions (Addendum)
 If you cannot work on upper body-work on core  Crunches/sit ups (what ever you prefer) at least 3 days per week   Read about the cardiac calcium scan  It may help us  determine if /when to put you on cholesterol medicine  Let us  know if you want us  to order    Take care of yourself  Stay active Keep eating healthy   Let us  know if you want to do the scan

## 2024-02-04 NOTE — Assessment & Plan Note (Addendum)
 Lab Results  Component Value Date   PSA 4.25 (H) 01/28/2024   PSA 5.7 09/17/2023   PSA 5.59 (H) 01/29/2023    Followed by urology  With bph  Had reassuring MRI this year (and bx in past)

## 2024-02-04 NOTE — Assessment & Plan Note (Signed)
 Reviewed health habits including diet and exercise and skin cancer prevention Reviewed appropriate screening tests for age  Also reviewed health mt list, fam hx and immunization status , as well as social and family history   See HPI Labs reviewed and ordered Health Maintenance  Topic Date Due   Hepatitis C Screening  Never done   Flu Shot  05/06/2024*   COVID-19 Vaccine (1 - 2025-26 season) 02/17/2025*   Medicare Annual Wellness Visit  01/16/2025   Colon Cancer Screening  09/23/2026   DTaP/Tdap/Td vaccine (4 - Td or Tdap) 02/01/2032   Pneumococcal Vaccine for age over 69  Completed   Zoster (Shingles) Vaccine  Completed   Meningitis B Vaccine  Aged Out  *Topic was postponed. The date shown is not the original due date.    Declines flu shot  Utd prostate care  Discussed fall prevention, supplements and exercise for bone density  -taking vitamin D PHQ 0 Discussed cardiac ca score in light of elevated asdvd risk of 23.7 - to help determine whether to start statin (no symptoms) -pt will read about this and let us  know whether to order it

## 2024-02-04 NOTE — Assessment & Plan Note (Signed)
 Seeing urology  Reviewed note from August  Psa is down a bit today Had MRI prostate /reassuring   Some voiding symptoms -not overly bothersome at this time Will continue yearly urology follow up

## 2024-02-04 NOTE — Assessment & Plan Note (Signed)
 Lab Results  Component Value Date   HGBA1C 5.3 01/28/2024   HGBA1C 5.5 01/29/2023   HGBA1C 5.3 01/23/2022   Continues under prediabetic range Good habits  disc imp of low glycemic diet and wt loss to prevent DM2

## 2024-02-04 NOTE — Assessment & Plan Note (Signed)
 Disc goals for lipids and reasons to control them Rev last labs with pt Rev low sat fat diet in detail LDL stable at 139 Ratio of 6 Inclined to start statin  Ascvd score 23.7 -we discussed cardiac ca score to further stratify and help with choice to take statin  Pt given info to read and will get back to us    No angina symptoms Does have family history

## 2024-02-04 NOTE — Assessment & Plan Note (Signed)
 bp in fair control at this time  BP Readings from Last 1 Encounters:  02/04/24 132/74   No changes needed Most recent labs reviewed  Disc lifstyle change with low sodium diet and exercise  Plan to continue benazeprail hct 10-12.5 mg daily

## 2024-02-04 NOTE — Assessment & Plan Note (Signed)
 Colonoscopy utd 09/2021 with 5 y recall

## 2024-02-04 NOTE — Progress Notes (Signed)
 "  Subjective:    Patient ID: Carlos Larson, male    DOB: 1955-09-26, 68 y.o.   MRN: [id number]  HPI  Here for health maintenance exam and to review chronic medical problems   Wt Readings from Last 3 Encounters:  02/04/24 185 lb (83.9 kg)  04/03/23 184 lb (83.5 kg)  02/02/23 184 lb 8 oz (83.7 kg)   29.41 kg/m  Vitals:   02/04/24 1002  BP: 132/74  Pulse: 63  Temp: 98.2 F (36.8 C)  SpO2: 95%    Immunization History  Administered Date(s) Administered   Influenza Split 10/22/2010   Influenza,inj,Quad PF,6+ Mos 11/14/2014, 11/19/2017, 10/18/2018   Influenza-Unspecified 11/16/2013, 11/06/2015, 12/05/2016, 11/20/2017   PNEUMOCOCCAL CONJUGATE-20 01/31/2022   Td 12/11/2001, 08/16/2009   Tdap 01/31/2022   Zoster Recombinant(Shingrix) 01/24/2019, 04/08/2019    Health Maintenance Due  Topic Date Due   Hepatitis C Screening  Never done   Flu shot - declines   Feeling good Hunting / outdoors almost daily Taking care of grand kids    Prostate health History of BPH and elevated psa  Psa was 5.7 in August at urologist  No symptoms at that time  Lab Results  Component Value Date   PSA 4.25 (H) 01/28/2024   PSA 5.7 09/17/2023   PSA 5.59 (H) 01/29/2023    [facility]  Treated for ED also   Has had prostate bx in past Also MRI was ordered after last visit  Result  IMPRESSION: 1. No focal lesion of intermediate or higher suspicion for prostate cancer is identified. 2. Prostatomegaly and benign prostatic hypertrophy. 3. Sigmoid colon diverticulosis.   Nocturia is 1-2 times per night  Some urgency  Not bothersome at thins time     Colon cancer screening  Colonoscopy 09/2021 with 5 y recall for polyps  Also has fam history of colon cancer    Bone health   Falls-none  Fractures-none  Supplements -vitamin D3  2000 international units daily   Also vit C and zink    Exercise  Hunting -lot of walking  Walks 1.5 mi five days per week   Has elbow tendonitis- left (saw doctor)- resting that before starting back on weight machine    Mood    02/04/2024   10:02 AM 01/17/2024    3:18 PM 04/03/2023    1:51 PM 01/16/2023   10:46 AM 02/01/2022    8:23 AM  Depression screen PHQ 2/9  Decreased Interest 0 0 0 0 0  Down, Depressed, Hopeless 0 0 0 0 0  PHQ - 2 Score 0 0 0 0 0  Altered sleeping 0  0    Tired, decreased energy 0  0    Change in appetite 0  0    Feeling bad or failure about yourself  0  0    Trouble concentrating 0  0    Moving slowly or fidgety/restless 0  0    Suicidal thoughts 0  0    PHQ-9 Score 0  0     Difficult doing work/chores Not difficult at all  Not difficult at all       Data saved with a previous flowsheet row definition   HTN bp is stable today  No cp or palpitations or headaches or edema  No side effects to medicines  BP Readings from Last 3 Encounters:  02/04/24 132/74  04/03/23 118/62  02/02/23 132/68    Benazepril  hct 10-12.5 mg daily   Lab Results  Component Value Date   NA 140 01/28/2024   K 3.8 01/28/2024   CO2 32 01/28/2024   GLUCOSE 93 01/28/2024   BUN 12 01/28/2024   CREATININE 1.00 01/28/2024   CALCIUM 9.2 01/28/2024   GFR 77.07 01/28/2024   GFRNONAA 86.49 11/24/2009    Hypothyroidism  Pt has no clinical changes No change in energy level/ hair or skin/ edema and no tremor Lab Results  Component Value Date   TSH 4.97 01/28/2024    Due for labs  Taking levothyroxine  88 mcg daily   Hyperlipidemia Lab Results  Component Value Date   CHOL 191 01/28/2024   CHOL 200 01/29/2023   CHOL 206 (H) 01/23/2022   Lab Results  Component Value Date   HDL 34.20 (L) 01/28/2024   HDL 34.60 (L) 01/29/2023   HDL 35.70 (L) 01/23/2022   Lab Results  Component Value Date   LDLCALC 139 (H) 01/28/2024   LDLCALC 135 (H) 01/29/2023   LDLCALC 144 (H) 01/23/2022   Lab Results  Component Value Date   TRIG 88.0 01/28/2024   TRIG 152.0 (H) 01/29/2023   TRIG 132.0  01/23/2022   Lab Results  Component Value Date   CHOLHDL 6 01/28/2024   CHOLHDL 6 01/29/2023   CHOLHDL 6 01/23/2022   Lab Results  Component Value Date   LDLDIRECT 103.0 01/13/2016   LDLDIRECT 80.4 11/25/2012   LDLDIRECT 156.2 12/01/2011     The 10-year ASCVD risk score (Arnett DK, et al., 2019) is: 23.7%   Values used to calculate the score:     Age: 39 years     Clinically relevant sex: Male     Is Non-Hispanic African American: No     Diabetic: No     Tobacco smoker: No     Systolic Blood Pressure: 132 mmHg     Is BP treated: Yes     HDL Cholesterol: 34.2 mg/dL     Total Cholesterol: 191 mg/dL  We discussed cardiac ca score in past   Diet is fairly good  Eats a lot of salads  Less red meat  Avoids fried foods    Lab Results  Component Value Date   ALT 20 01/28/2024   AST 21 01/28/2024   ALKPHOS 68 01/28/2024   BILITOT 1.1 01/28/2024    Lab Results  Component Value Date   WBC 6.2 01/28/2024   HGB 16.5 01/28/2024   HCT 47.4 01/28/2024   MCV 93.2 01/28/2024   PLT 193.0 01/28/2024      Patient Active Problem List   Diagnosis Date Noted   Eczema of hand 02/02/2023   BPH (benign prostatic hyperplasia) 01/31/2022   Elevated random blood glucose level 01/17/2016   PSA elevation 01/17/2016   Hyperlipidemia 01/12/2014   Hematuria 12/02/2012   Routine general medical examination at a health care facility 12/26/2010   Prostate cancer screening 12/26/2010   Family history of colon cancer 12/26/2010   Hypothyroidism 06/22/2009   Essential hypertension 06/22/2009   Past Medical History:  Diagnosis Date   Cataract    HTN (hypertension)    Hyperlipidemia    border line no medication updated 08/24/21   Hypothyroid    Pollen allergies    mild   Past Surgical History:  Procedure Laterality Date   COLONOSCOPY  01/12/2006, 02/10/2011   2007 - Normal (Dr. Dianna) 2013 - mild diverticulosis Ollen)   TONSILLECTOMY  1971   and adnoids   Social  History[1] Family History  Problem Relation Age of Onset  Heart disease Mother        valvular disease   Hypertension Mother    Colon polyps Father    Colon cancer Father 97   Hypertension Father    Heart disease Brother    Heart disease Brother    Heart disease Maternal Grandmother    Coronary artery disease Maternal Grandmother    Prostate cancer Maternal Grandfather    Hypertension Paternal Grandfather    Diabetes Neg Hx    Crohn's disease Neg Hx    Esophageal cancer Neg Hx    Rectal cancer Neg Hx    Stomach cancer Neg Hx    Allergies[2] Medications Ordered Prior to Encounter[3]  Review of Systems  Constitutional:  Negative for activity change, appetite change, fatigue, fever and unexpected weight change.  HENT:  Negative for congestion, rhinorrhea, sore throat and trouble swallowing.   Eyes:  Negative for pain, redness, itching and visual disturbance.  Respiratory:  Negative for cough, chest tightness, shortness of breath and wheezing.   Cardiovascular:  Negative for chest pain and palpitations.  Gastrointestinal:  Negative for abdominal pain, blood in stool, constipation, diarrhea and nausea.  Endocrine: Negative for cold intolerance, heat intolerance, polydipsia and polyuria.  Genitourinary:  Negative for difficulty urinating, dysuria, frequency and urgency.  Musculoskeletal:  Negative for arthralgias, joint swelling and myalgias.  Skin:  Negative for pallor and rash.  Neurological:  Negative for dizziness, tremors, weakness, numbness and headaches.  Hematological:  Negative for adenopathy. Does not bruise/bleed easily.  Psychiatric/Behavioral:  Negative for decreased concentration and dysphoric mood. The patient is not nervous/anxious.        Objective:   Physical Exam Constitutional:      General: He is not in acute distress.    Appearance: Normal appearance. He is well-developed. He is not ill-appearing or diaphoretic.     Comments: Overweight   HENT:      Head: Normocephalic and atraumatic.     Right Ear: Tympanic membrane, ear canal and external ear normal.     Left Ear: Tympanic membrane, ear canal and external ear normal.     Nose: Nose normal. No congestion.     Mouth/Throat:     Mouth: Mucous membranes are moist.     Pharynx: Oropharynx is clear. No posterior oropharyngeal erythema.  Eyes:     General: No scleral icterus.       Right eye: No discharge.        Left eye: No discharge.     Conjunctiva/sclera: Conjunctivae normal.     Pupils: Pupils are equal, round, and reactive to light.  Neck:     Thyroid : No thyromegaly.     Vascular: No carotid bruit or JVD.  Cardiovascular:     Rate and Rhythm: Normal rate and regular rhythm.     Pulses: Normal pulses.     Heart sounds: Normal heart sounds.     No gallop.  Pulmonary:     Effort: Pulmonary effort is normal. No respiratory distress.     Breath sounds: Normal breath sounds. No wheezing or rales.     Comments: Good air exch Chest:     Chest wall: No tenderness.  Abdominal:     General: Bowel sounds are normal. There is no distension or abdominal bruit.     Palpations: Abdomen is soft. There is no mass.     Tenderness: There is no abdominal tenderness.     Hernia: No hernia is present.  Musculoskeletal:  General: No tenderness.     Cervical back: Normal range of motion and neck supple. No rigidity. No muscular tenderness.     Right lower leg: No edema.     Left lower leg: No edema.  Lymphadenopathy:     Cervical: No cervical adenopathy.  Skin:    General: Skin is warm and dry.     Coloration: Skin is not pale.     Findings: No erythema or rash.     Comments: Solar lentigines diffusely  Occational skin tag   Neurological:     Mental Status: He is alert.     Cranial Nerves: No cranial nerve deficit.     Motor: No abnormal muscle tone.     Coordination: Coordination normal.     Gait: Gait normal.     Deep Tendon Reflexes: Reflexes are normal and symmetric.  Reflexes normal.  Psychiatric:        Mood and Affect: Mood normal.        Cognition and Memory: Cognition and memory normal.           Assessment & Plan:   Problem List Items Addressed This Visit       Cardiovascular and Mediastinum   Essential hypertension   bp in fair control at this time  BP Readings from Last 1 Encounters:  02/04/24 132/74   No changes needed Most recent labs reviewed  Disc lifstyle change with low sodium diet and exercise  Plan to continue benazeprail hct 10-12.5 mg daily      Relevant Medications   benazepril -hydrochlorthiazide (LOTENSIN  HCT) 10-12.5 MG tablet     Endocrine   Hypothyroidism   Hypothyroidism  Pt has no clinical changes No change in energy level/ hair or skin/ edema and no tremor Lab Results  Component Value Date   TSH 4.97 01/28/2024    Plan to continue levothyroxine  88 mcg daily      Relevant Medications   levothyroxine  (SYNTHROID ) 88 MCG tablet     Genitourinary   BPH (benign prostatic hyperplasia)   Seeing urology  Reviewed note from August  Psa is down a bit today Had MRI prostate /reassuring   Some voiding symptoms -not overly bothersome at this time Will continue yearly urology follow up         Other   Routine general medical examination at a health care facility - Primary   Reviewed health habits including diet and exercise and skin cancer prevention Reviewed appropriate screening tests for age  Also reviewed health mt list, fam hx and immunization status , as well as social and family history   See HPI Labs reviewed and ordered Health Maintenance  Topic Date Due   Hepatitis C Screening  Never done   Flu Shot  05/06/2024*   COVID-19 Vaccine (1 - 2025-26 season) 02/17/2025*   Medicare Annual Wellness Visit  01/16/2025   Colon Cancer Screening  09/23/2026   DTaP/Tdap/Td vaccine (4 - Td or Tdap) 02/01/2032   Pneumococcal Vaccine for age over 77  Completed   Zoster (Shingles) Vaccine  Completed    Meningitis B Vaccine  Aged Out  *Topic was postponed. The date shown is not the original due date.    Declines flu shot  Utd prostate care  Discussed fall prevention, supplements and exercise for bone density  -taking vitamin D PHQ 0 Discussed cardiac ca score in light of elevated asdvd risk of 23.7 - to help determine whether to start statin (no symptoms) -pt will read about this  and let us  know whether to order it        PSA elevation   Lab Results  Component Value Date   PSA 4.25 (H) 01/28/2024   PSA 5.7 09/17/2023   PSA 5.59 (H) 01/29/2023    Followed by urology  With bph  Had reassuring MRI this year (and bx in past)       Prostate cancer screening   Continues urol care Lab Results  Component Value Date   PSA 4.25 (H) 01/28/2024   PSA 5.7 09/17/2023   PSA 5.59 (H) 01/29/2023   Reassuring mri prostate this year  reassu      Hyperlipidemia   Disc goals for lipids and reasons to control them Rev last labs with pt Rev low sat fat diet in detail LDL stable at 139 Ratio of 6 Inclined to start statin  Ascvd score 23.7 -we discussed cardiac ca score to further stratify and help with choice to take statin  Pt given info to read and will get back to us    No angina symptoms Does have family history       Relevant Medications   benazepril -hydrochlorthiazide (LOTENSIN  HCT) 10-12.5 MG tablet   Family history of colon cancer   Colonoscopy utd 09/2021 with 5 y recall      Elevated random blood glucose level   Lab Results  Component Value Date   HGBA1C 5.3 01/28/2024   HGBA1C 5.5 01/29/2023   HGBA1C 5.3 01/23/2022   Continues under prediabetic range Good habits  disc imp of low glycemic diet and wt loss to prevent DM2          [1]  Social History Tobacco Use   Smoking status: Former    Current packs/day: 0.00    Types: Cigarettes    Quit date: 02/07/1972    Years since quitting: 52.0    Passive exposure: Never   Smokeless tobacco: Never   Tobacco  comments:    Smoked for 4 years  Vaping Use   Vaping status: Never Used  Substance Use Topics   Alcohol use: No    Alcohol/week: 0.0 standard drinks of alcohol   Drug use: No  [2] No Known Allergies [3]  Current Outpatient Medications on File Prior to Visit  Medication Sig Dispense Refill   Cholecalciferol (VITAMIN D) 50 MCG (2000 UT) tablet Take 2,000 Units by mouth daily.     fluticasone  (FLONASE ) 50 MCG/ACT nasal spray Place 1 spray into both nostrils daily as needed for allergies or rhinitis.     Omega-3 Fatty Acids (FISH OIL) 1200 MG CPDR Take 1 capsule by mouth.     sildenafil (VIAGRA) 100 MG tablet Take 50 mg by mouth daily as needed for erectile dysfunction.     vitamin C (ASCORBIC ACID) 500 MG tablet Take 500 mg by mouth daily.     zinc gluconate 50 MG tablet Take 50 mg by mouth daily.     No current facility-administered medications on file prior to visit.   "

## 2024-02-04 NOTE — Assessment & Plan Note (Signed)
 Hypothyroidism  Pt has no clinical changes No change in energy level/ hair or skin/ edema and no tremor Lab Results  Component Value Date   TSH 4.97 01/28/2024    Plan to continue levothyroxine  88 mcg daily

## 2025-01-28 ENCOUNTER — Other Ambulatory Visit

## 2025-02-04 ENCOUNTER — Encounter: Admitting: Family Medicine
